# Patient Record
Sex: Female | Born: 1990 | Hispanic: No | Smoking: Never smoker
Health system: Southern US, Community
[De-identification: ages and names within clinical notes are randomized; demographics above are authoritative.]

## PROBLEM LIST (undated history)

## (undated) ENCOUNTER — Inpatient Hospital Stay (HOSPITAL_COMMUNITY): Payer: Self-pay

## (undated) DIAGNOSIS — E119 Type 2 diabetes mellitus without complications: Secondary | ICD-10-CM

## (undated) DIAGNOSIS — O09299 Supervision of pregnancy with other poor reproductive or obstetric history, unspecified trimester: Secondary | ICD-10-CM

## (undated) HISTORY — PX: NO PAST SURGERIES: SHX2092

---

## 2013-12-18 DIAGNOSIS — F329 Major depressive disorder, single episode, unspecified: Secondary | ICD-10-CM | POA: Insufficient documentation

## 2013-12-18 DIAGNOSIS — F32A Depression, unspecified: Secondary | ICD-10-CM | POA: Insufficient documentation

## 2013-12-18 DIAGNOSIS — O1490 Unspecified pre-eclampsia, unspecified trimester: Secondary | ICD-10-CM

## 2014-06-19 ENCOUNTER — Emergency Department: Payer: Self-pay | Admitting: Student

## 2014-06-19 LAB — CBC
HCT: 42 % (ref 35.0–47.0)
HGB: 12.6 g/dL (ref 12.0–16.0)
MCH: 21 pg — ABNORMAL LOW (ref 26.0–34.0)
MCHC: 30 g/dL — ABNORMAL LOW (ref 32.0–36.0)
MCV: 70 fL — ABNORMAL LOW (ref 80–100)
PLATELETS: 234 10*3/uL (ref 150–440)
RBC: 5.99 10*6/uL — AB (ref 3.80–5.20)
RDW: 16.1 % — ABNORMAL HIGH (ref 11.5–14.5)
WBC: 10.5 10*3/uL (ref 3.6–11.0)

## 2014-06-19 LAB — COMPREHENSIVE METABOLIC PANEL
ALK PHOS: 96 U/L
Albumin: 3.3 g/dL — ABNORMAL LOW (ref 3.4–5.0)
Anion Gap: 6 — ABNORMAL LOW (ref 7–16)
BUN: 13 mg/dL (ref 7–18)
Bilirubin,Total: 0.4 mg/dL (ref 0.2–1.0)
CHLORIDE: 107 mmol/L (ref 98–107)
CO2: 25 mmol/L (ref 21–32)
Calcium, Total: 8.9 mg/dL (ref 8.5–10.1)
Creatinine: 0.7 mg/dL (ref 0.60–1.30)
EGFR (African American): >60
EGFR (Non-African Amer.): >60
Glucose: 176 mg/dL — ABNORMAL HIGH (ref 65–99)
Osmolality: 280 (ref 275–301)
Potassium: 4 mmol/L (ref 3.5–5.1)
SGOT(AST): 15 U/L (ref 15–37)
SGPT (ALT): 19 U/L
Sodium: 138 mmol/L (ref 136–145)
TOTAL PROTEIN: 7.2 g/dL (ref 6.4–8.2)

## 2014-06-19 LAB — URINALYSIS, COMPLETE
Bilirubin,UR: NEGATIVE
Blood: NEGATIVE
Glucose,UR: NEGATIVE mg/dL (ref 0–75)
Leukocyte Esterase: NEGATIVE
Nitrite: NEGATIVE
Ph: 5 (ref 4.5–8.0)
Protein: NEGATIVE
RBC,UR: 8 /HPF (ref 0–5)
Specific Gravity: 1.015 (ref 1.003–1.030)
WBC UR: NONE SEEN /HPF (ref 0–5)

## 2014-06-19 LAB — LIPASE, BLOOD: LIPASE: 94 U/L (ref 73–393)

## 2016-05-01 ENCOUNTER — Encounter (HOSPITAL_COMMUNITY): Payer: Self-pay | Admitting: Emergency Medicine

## 2016-05-01 ENCOUNTER — Emergency Department (HOSPITAL_COMMUNITY)
Admission: EM | Admit: 2016-05-01 | Discharge: 2016-05-02 | Disposition: A | Discharge status: Home / Self Care | Payer: Self-pay | Attending: Emergency Medicine | Admitting: Emergency Medicine

## 2016-05-01 DIAGNOSIS — T7421XA Adult sexual abuse, confirmed, initial encounter: Secondary | ICD-10-CM | POA: Insufficient documentation

## 2016-05-01 MED ORDER — LORAZEPAM 1 MG PO TABS
1.0000 mg | ORAL_TABLET | Freq: Once | ORAL | Status: AC
  Administered 2016-05-02: 1 mg via ORAL
  Filled 2016-05-01: qty 1

## 2016-05-01 NOTE — ED Notes (Signed)
Spoke to [employer]ANE RN who states okay for pt to eat and drink.  Will make MD/PA aware of items needed for medical clearance and re-page SANE RN when pt ready for dispo.
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
## 2016-05-01 NOTE — ED Notes (Signed)
Patient requesting STD screening

## 2016-05-02 ENCOUNTER — Ambulatory Visit (HOSPITAL_COMMUNITY)
Admission: EM | Admit: 2016-05-02 | Discharge: 2016-05-02 | Disposition: A | Discharge status: Home / Self Care | Payer: No Typology Code available for payment source | Source: Ambulatory Visit | Attending: Emergency Medicine | Admitting: Emergency Medicine

## 2016-05-02 DIAGNOSIS — Z0441 Encounter for examination and observation following alleged adult rape: Secondary | ICD-10-CM | POA: Diagnosis not present

## 2016-05-02 MED ORDER — ULIPRISTAL ACETATE 30 MG PO TABS
30.0000 mg | ORAL_TABLET | Freq: Once | ORAL | Status: AC
  Administered 2016-05-02: 30 mg via ORAL
  Filled 2016-05-02 (×2): qty 1

## 2016-05-02 MED ORDER — CEFTRIAXONE SODIUM 250 MG IJ SOLR
250.0000 mg | Freq: Once | INTRAMUSCULAR | Status: AC
  Administered 2016-05-02: 250 mg via INTRAMUSCULAR
  Filled 2016-05-02: qty 250

## 2016-05-02 MED ORDER — LIDOCAINE HCL (PF) 1 % IJ SOLN
0.9000 mL | Freq: Once | INTRAMUSCULAR | Status: AC
  Administered 2016-05-02: 0.9 mL
  Filled 2016-05-02 (×2): qty 5

## 2016-05-02 MED ORDER — AZITHROMYCIN 250 MG PO TABS
1000.0000 mg | ORAL_TABLET | Freq: Once | ORAL | Status: AC
  Administered 2016-05-02: 1000 mg via ORAL
  Filled 2016-05-02: qty 4

## 2016-05-02 MED ORDER — METRONIDAZOLE 500 MG PO TABS
2000.0000 mg | ORAL_TABLET | Freq: Once | ORAL | Status: DC
  Filled 2016-05-02: qty 4

## 2016-05-02 NOTE — ED Notes (Signed)
SANE RN at bedside.
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
## 2016-05-05 LAB — POC URINE PREG, ED: Preg Test, Ur: NEGATIVE

## 2016-05-25 ENCOUNTER — Encounter: Payer: No Typology Code available for payment source | Admitting: Obstetrics & Gynecology

## 2016-05-25 NOTE — Progress Notes (Signed)
Pt did not arrive for appt.  clh-S

## 2016-05-25 NOTE — SANE Note (Deleted)
Test for date/time blank note.

## 2016-05-25 NOTE — Discharge Instructions (Signed)
Sexual Assault or Rape °Sexual assault is any sexual activity that a person is forced, threatened, or coerced into participating in. It may or may not involve physical contact. You are being sexually abused if you are forced to have sexual contact of any kind. Sexual assault is called rape if penetration has occurred (vaginal, oral, or anal). Many times, sexual assaults are committed by a friend, relative, or associate. Sexual assault and rape are never the victim's fault.  °Sexual assault can result in various health problems for the person who was assaulted. Some of these problems include: °· Physical injuries in the genital area or other areas of the body. °· Risk of unwanted pregnancy. °· Risk of sexually transmitted infections (STIs). °· Psychological problems such as anxiety, depression, or posttraumatic stress disorder. °WHAT STEPS SHOULD BE TAKEN AFTER A SEXUAL ASSAULT? °If you have been sexually assaulted, you should take the following steps as soon as possible: °· Go to a safe area as quickly as possible and call your local emergency services (911 in [place] Get away from the area where you have been attacked.   °· Do not wash, shower, comb your hair, or clean any part of your body.   °· Do not change your clothes.   °· Do not remove or touch anything in the area where you were assaulted.   °· Go to an emergency room for a complete physical exam. Get the necessary tests to protect yourself from STIs or pregnancy. You may be treated for an STI even if no signs of one are present. Emergency contraceptive medicines are also available to help prevent pregnancy, if this is desired. You may need to be examined by a specially trained health care provider. °· Have the health care provider collect evidence during the exam, even if you are not sure if you will file a report with the police. °· Find out how to file the correct papers with the authorities. This is important for all assaults, even if they were committed  by a family member or friend. °· Find out where you can get additional help and support, such as a local rape crisis center. °· Follow up with your health care provider as directed.   °HOW CAN YOU REDUCE THE CHANCES OF SEXUAL ASSAULT? °Take the following steps to help reduce your chances of being sexually assaulted: °· Consider carrying mace or pepper spray for protection against an attacker.   °· Consider taking a self-defense course. °· Do not try to fight off an attacker if he or she has a gun or knife.   °· Be aware of your surroundings, what is happening around you, and who might be there.   °· Be assertive, trust your instincts, and walk with confidence and direction. °· Be careful not to drink too much alcohol or use other intoxicants. These can reduce your ability to fight off an assault. °· Always lock your doors and windows. Be sure to have high-quality locks for your home.   °· Do not let people enter your house if you do not know them.   °· Get a home security system that has a siren if you are able.   °· Protect the keys to your house and car. Do not lend them out. Do not put your name and address on them. If you lose them, get your locks changed.   °· Always lock your car and have your key ready to open the door before approaching the car.   °· Park in a well-lit and busy area. °· Plan your driving routes   so that you travel on well-lit and frequently used streets.  °· Keep your car serviced. Always have at least half a tank of gas in it.   °· Do not go into isolated areas alone. This includes open garages, empty buildings or offices, or public laundry rooms.   °· Do not walk or jog alone, especially when it is dark.   °· Never hitchhike.   °· If your car breaks down, call the police for help on your cell phone and stay inside the car with your doors locked and windows up.   °· If you are being followed, go to a busy area and call for help.   °· If you are stopped by a police officer, especially one in  an unmarked police car, keep your door locked. Do not put your window down all the way. Ask the officer to show you identification first.   °· Be aware of "date rape drugs" that can be placed in a drink when you are not looking. These drugs can make you unable to fight off an assault. °FOR MORE INFORMATION °· Office on Women's Health, U.S. Department of Health and Human Services: www.womenshealth.gov/violence-against-women/types-of-violence/sexual-assault-and-abuse.html °· National Sexual Assault Hotline: 1-800-656-HOPE (4673) °· National Domestic Violence Hotline: 1-800-799-SAFE (7233) or www.thehotline.org °  °This information is not intended to replace advice given to you by your health care provider. Make sure you discuss any questions you have with your health care provider. °  °Document Released: 10/23/2000 Document Revised: 06/28/2013 Document Reviewed: 05/31/2015 °Elsevier Interactive Patient Education ©2016 Elsevier Inc. ° °
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
## 2017-06-27 ENCOUNTER — Inpatient Hospital Stay (HOSPITAL_COMMUNITY): Payer: Medicaid Other

## 2017-06-27 ENCOUNTER — Encounter (HOSPITAL_COMMUNITY): Payer: Self-pay

## 2017-06-27 ENCOUNTER — Inpatient Hospital Stay (HOSPITAL_COMMUNITY)
Admission: AD | Admit: 2017-06-27 | Discharge: 2017-06-27 | Disposition: A | Discharge status: Home / Self Care | Payer: Medicaid Other | Source: Ambulatory Visit | Attending: Obstetrics & Gynecology | Admitting: Obstetrics & Gynecology

## 2017-06-27 DIAGNOSIS — O208 Other hemorrhage in early pregnancy: Secondary | ICD-10-CM | POA: Insufficient documentation

## 2017-06-27 DIAGNOSIS — O23591 Infection of other part of genital tract in pregnancy, first trimester: Secondary | ICD-10-CM | POA: Insufficient documentation

## 2017-06-27 DIAGNOSIS — O26899 Other specified pregnancy related conditions, unspecified trimester: Secondary | ICD-10-CM

## 2017-06-27 DIAGNOSIS — O468X1 Other antepartum hemorrhage, first trimester: Secondary | ICD-10-CM

## 2017-06-27 DIAGNOSIS — B9689 Other specified bacterial agents as the cause of diseases classified elsewhere: Secondary | ICD-10-CM

## 2017-06-27 DIAGNOSIS — Z3A01 Less than 8 weeks gestation of pregnancy: Secondary | ICD-10-CM | POA: Diagnosis not present

## 2017-06-27 DIAGNOSIS — N760 Acute vaginitis: Secondary | ICD-10-CM

## 2017-06-27 DIAGNOSIS — R109 Unspecified abdominal pain: Secondary | ICD-10-CM | POA: Insufficient documentation

## 2017-06-27 DIAGNOSIS — O418X10 Other specified disorders of amniotic fluid and membranes, first trimester, not applicable or unspecified: Secondary | ICD-10-CM

## 2017-06-27 DIAGNOSIS — O26891 Other specified pregnancy related conditions, first trimester: Secondary | ICD-10-CM

## 2017-06-27 DIAGNOSIS — O209 Hemorrhage in early pregnancy, unspecified: Secondary | ICD-10-CM

## 2017-06-27 HISTORY — DX: Type 2 diabetes mellitus without complications: E11.9

## 2017-06-27 LAB — URINALYSIS, ROUTINE W REFLEX MICROSCOPIC
BACTERIA UA: NONE SEEN
Bilirubin Urine: NEGATIVE
HGB URINE DIPSTICK: NEGATIVE
Ketones, ur: NEGATIVE mg/dL
LEUKOCYTES UA: NEGATIVE
NITRITE: NEGATIVE
PH: 6 (ref 5.0–8.0)
Protein, ur: NEGATIVE mg/dL
SPECIFIC GRAVITY, URINE: 1.03 (ref 1.005–1.030)

## 2017-06-27 LAB — WET PREP, GENITAL
SPERM: NONE SEEN
TRICH WET PREP: NONE SEEN
YEAST WET PREP: NONE SEEN

## 2017-06-27 LAB — CBC
HEMATOCRIT: 35.8 % — AB (ref 36.0–46.0)
HEMOGLOBIN: 11.7 g/dL — AB (ref 12.0–15.0)
MCH: 22 pg — ABNORMAL LOW (ref 26.0–34.0)
MCHC: 32.7 g/dL (ref 30.0–36.0)
MCV: 67.4 fL — ABNORMAL LOW (ref 78.0–100.0)
Platelets: 296 10*3/uL (ref 150–400)
RBC: 5.31 MIL/uL — ABNORMAL HIGH (ref 3.87–5.11)
RDW: 14.4 % (ref 11.5–15.5)
WBC: 7.5 10*3/uL (ref 4.0–10.5)

## 2017-06-27 LAB — POCT PREGNANCY, URINE: PREG TEST UR: POSITIVE — AB

## 2017-06-27 LAB — ABO/RH: ABO/RH(D): A POS

## 2017-06-27 LAB — HCG, QUANTITATIVE, PREGNANCY: HCG, BETA CHAIN, QUANT, S: 21646 m[IU]/mL — AB (ref <5)

## 2017-06-27 MED ORDER — METRONIDAZOLE 500 MG PO TABS: 500.0000 mg | ORAL_TABLET | Freq: Two times a day (BID) | ORAL | 0 refills | Status: DC

## 2017-06-27 NOTE — MAU Note (Signed)
Reports lots of back and abdominal pain. Went to the bathroom when she was out and noticed some brown discharge when she wiped. No recent intercourse. No clots that she could tell.

## 2017-06-27 NOTE — Discharge Instructions (Signed)
Abdominal Pain During Pregnancy Abdominal pain is common in pregnancy. Most of the time, it does not cause harm. There are many causes of abdominal pain. Some causes are more serious than others and sometimes the cause is not known. Abdominal pain can be a sign that something is very wrong with the pregnancy or the pain may have nothing to do with the pregnancy. Always tell your health care provider if you have any abdominal pain. Follow these instructions at home:  Do not have sex or put anything in your vagina until your symptoms go away completely.  Watch your abdominal pain for any changes.  Get plenty of rest until your pain improves.  Drink enough fluid to keep your urine clear or pale yellow.  Take over-the-counter or prescription medicines only as told by your health care provider.  Keep all follow-up visits as told by your health care provider. This is important. Contact a health care provider if:  You have a fever.  Your pain gets worse or you have cramping.  Your pain continues after resting. Get help right away if:  You are bleeding, leaking fluid, or passing tissue from the vagina.  You have vomiting or diarrhea that does not go away.  You have painful or bloody urination.  You notice a decrease in your baby's movements.  You feel very weak or faint.  You have shortness of breath.  You develop a severe headache with abdominal pain.  You have abnormal vaginal discharge with abdominal pain. This information is not intended to replace advice given to you by your health care provider. Make sure you discuss any questions you have with your health care provider. Document Released: 10/26/2005 Document Revised: 08/06/2016 Document Reviewed: 05/25/2013 Elsevier Interactive Patient Education  2018 Elsevier Inc.  Subchorionic Hematoma A subchorionic hematoma is a gathering of blood between the outer wall of the placenta and the inner wall of the womb (uterus). The  placenta is the organ that connects the fetus to the wall of the uterus. The placenta performs the feeding, breathing (oxygen to the fetus), and waste removal (excretory work) of the fetus. Subchorionic hematoma is the most common abnormality found on a result from ultrasonography done during the first trimester or early second trimester of pregnancy. If there has been little or no vaginal bleeding, early small hematomas usually shrink on their own and do not affect your baby or pregnancy. The blood is gradually absorbed over 1-2 weeks. When bleeding starts later in pregnancy or the hematoma is larger or occurs in an older pregnant woman, the outcome may not be as good. Larger hematomas may get bigger, which increases the chances for miscarriage. Subchorionic hematoma also increases the risk of premature detachment of the placenta from the uterus, preterm (premature) labor, and stillbirth. Follow these instructions at home:  Stay on bed rest if your health care provider recommends this. Although bed rest will not prevent more bleeding or prevent a miscarriage, your health care provider may recommend bed rest until you are advised otherwise.  Avoid heavy lifting (more than 10 lb [4.5 kg]), exercise, sexual intercourse, or douching as directed by your health care provider.  Keep track of the number of pads you use each day and how soaked (saturated) they are. Write down this information.  Do not use tampons.  Keep all follow-up appointments as directed by your health care provider. Your health care provider may ask you to have follow-up blood tests or ultrasound tests or both. Get help right  away if:  You have severe cramps in your stomach, back, abdomen, or pelvis.  You have a fever.  You pass large clots or tissue. Save any tissue for your health care provider to look at.  Your bleeding increases or you become lightheaded, feel weak, or have fainting episodes. This information is not intended  to replace advice given to you by your health care provider. Make sure you discuss any questions you have with your health care provider. Document Released: 02/10/2007 Document Revised: 04/02/2016 Document Reviewed: 05/25/2013 Elsevier Interactive Patient Education  2017 Elsevier Inc.  Vaginal Bleeding During Pregnancy, First Trimester A small amount of bleeding (spotting) from the vagina is common in early pregnancy. Sometimes the bleeding is normal and is not a problem, and sometimes it is a sign of something serious. Be sure to tell your doctor about any bleeding from your vagina right away. Follow these instructions at home:  Watch your condition for any changes.  Follow your doctor's instructions about how active you can be.  If you are on bed rest: ? You may need to stay in bed and only get up to use the bathroom. ? You may be allowed to do some activities. ? If you need help, make plans for someone to help you.  Write down: ? The number of pads you use each day. ? How often you change pads. ? How soaked (saturated) your pads are.  Do not use tampons.  Do not douche.  Do not have sex or orgasms until your doctor says it is okay.  If you pass any tissue from your vagina, save the tissue so you can show it to your doctor.  Only take medicines as told by your doctor.  Do not take aspirin because it can make you bleed.  Keep all follow-up visits as told by your doctor. Contact a doctor if:  You bleed from your vagina.  You have cramps.  You have labor pains.  You have a fever that does not go away after you take medicine. Get help right away if:  You have very bad cramps in your back or belly (abdomen).  You pass large clots or tissue from your vagina.  You bleed more.  You feel light-headed or weak.  You pass out (faint).  You have chills.  You are leaking fluid or have a gush of fluid from your vagina.  You pass out while pooping (having a bowel  movement). This information is not intended to replace advice given to you by your health care provider. Make sure you discuss any questions you have with your health care provider. Document Released: 03/12/2014 Document Revised: 04/02/2016 Document Reviewed: 07/03/2013 Elsevier Interactive Patient Education  [employer].

## 2017-06-27 NOTE — MAU Provider Note (Signed)
History     CSN: [id number]  Arrival date and time: 06/27/17 1437   First Provider Initiated Contact with Patient 06/27/17 1541      Chief Complaint  Patient presents with  . Vaginal Bleeding  . Back Pain  . Abdominal Pain   G2P1001 @[redacted]w[redacted]d  by LMP here with abdominal cramping. Cramping started about 2 weeks ago. Describes as bilateral, lower abdomen, and worse on left side. Reports brown discharge today. No fevers. No urinary sx.     OB History    Gravida Para Term Preterm AB Living   2 1 1     1    SAB TAB Ectopic Multiple Live Births                  Past Medical History:  Diagnosis Date  . Diabetes mellitus without complication (HCC)    type2    Past Surgical History:  Procedure Laterality Date  . NO PAST SURGERIES      Family History  Problem Relation Age of Onset  . Diabetes Mother     Social History  Substance Use Topics  . Smoking status: Never Smoker  . Smokeless tobacco: Never Used  . Alcohol use No    Allergies: No Known Allergies  No prescriptions prior to admission.    Review of Systems  Constitutional: Negative for fever.  Gastrointestinal: Positive for abdominal pain and constipation.  Genitourinary: Positive for vaginal discharge. Negative for dysuria, frequency and urgency.   Physical Exam   Blood pressure 130/79, pulse (!) 105, temperature 98.3 F (36.8 C), temperature source Oral, resp. rate 16, height 5\' 4"  (1.626 m), weight 208 lb (94.3 kg), last menstrual period 05/04/2017.  Physical Exam  Nursing note and vitals reviewed. Constitutional: She is oriented to person, place, and time. She appears well-developed and well-nourished. No distress.  HENT:  Head: Normocephalic.  Neck: Normal range of motion.  Respiratory: Effort normal. No respiratory distress.  GI: Soft. She exhibits no distension and no mass. There is no tenderness. There is no rebound and no guarding.  Genitourinary:  Genitourinary Comments: External: no lesions  or erythema Vagina: rugated, pink, moist, moderate white frothy discharge Uterus: non enlarged, anteverted, non tender, no CMT Adnexae: no masses, no tenderness left, no tenderness right   Musculoskeletal: Normal range of motion.  Neurological: She is alert and oriented to person, place, and time.  Skin: Skin is warm and dry.  Psychiatric: She has a normal mood and affect.   Results for orders placed or performed during the hospital encounter of 06/27/17 (from the past 24 hour(s))  Urinalysis, Routine w reflex microscopic     Status: Abnormal   Collection Time: 06/27/17  2:51 PM  Result Value Ref Range   Color, Urine STRAW (A) YELLOW   APPearance CLEAR CLEAR   Specific Gravity, Urine 1.030 1.005 - 1.030   pH 6.0 5.0 - 8.0   Glucose, UA >=500 (A) NEGATIVE mg/dL   Hgb urine dipstick NEGATIVE NEGATIVE   Bilirubin Urine NEGATIVE NEGATIVE   Ketones, ur NEGATIVE NEGATIVE mg/dL   Protein, ur NEGATIVE NEGATIVE mg/dL   Nitrite NEGATIVE NEGATIVE   Leukocytes, UA NEGATIVE NEGATIVE   RBC / HPF 0-5 0 - 5 RBC/hpf   WBC, UA 0-5 0 - 5 WBC/hpf   Bacteria, UA NONE SEEN NONE SEEN   Squamous Epithelial / LPF 0-5 (A) NONE SEEN  Pregnancy, urine POC     Status: Abnormal   Collection Time: 06/27/17  2:55 PM  Result Value  Ref Range   Preg Test, Ur POSITIVE (A) NEGATIVE  ABO/Rh     Status: None (Preliminary result)   Collection Time: 06/27/17  3:12 PM  Result Value Ref Range   ABO/RH(D) A POS   CBC     Status: Abnormal   Collection Time: 06/27/17  3:12 PM  Result Value Ref Range   WBC 7.5 4.0 - 10.5 K/uL   RBC 5.31 (H) 3.87 - 5.11 MIL/uL   Hemoglobin 11.7 (L) 12.0 - 15.0 g/dL   HCT 81.1 (L) 91.4 - 78.2 %   MCV 67.4 (L) 78.0 - 100.0 fL   MCH 22.0 (L) 26.0 - 34.0 pg   MCHC 32.7 30.0 - 36.0 g/dL   RDW 95.6 21.3 - 08.6 %   Platelets 296 150 - 400 K/uL  Wet prep, genital     Status: Abnormal   Collection Time: 06/27/17  3:50 PM  Result Value Ref Range   Yeast Wet Prep HPF POC NONE SEEN NONE  SEEN   Trich, Wet Prep NONE SEEN NONE SEEN   Clue Cells Wet Prep HPF POC PRESENT (A) NONE SEEN   WBC, Wet Prep HPF POC MANY (A) NONE SEEN   Sperm NONE SEEN    US Ob Comp Less 14 Wks  Result Date: 06/27/2017 CLINICAL DATA:  Pain.  Pregnant patient. EXAM: OBSTETRIC <14 WK [place] AND TRANSVAGINAL OB US TECHNIQUE: Both transabdominal and transvaginal ultrasound examinations were performed for complete evaluation of the gestation as well as the maternal uterus, adnexal regions, and pelvic cul-de-sac. Transvaginal technique was performed to assess early pregnancy. COMPARISON:  None. FINDINGS: Intrauterine gestational sac: Single Yolk sac:  Visualized. Embryo:  Visualized. Cardiac Activity: Visualized. Heart Rate: 127  bpm MSD:   mm    w     d CRL:  9.5  mm   6 w   6 d                  [place] [facility]: February 14, 2018 Subchorionic hemorrhage: There is a small inferior subchorionic hemorrhage. Maternal uterus/adnexae: Both ovaries are normal in appearance. IMPRESSION: 1. Single live IUP.  Small inferior subchorionic hemorrhage. Electronically Signed   By: Gerome Sam III M.D   On: 06/27/2017 17:08   US Ob Transvaginal  Result Date: 06/27/2017 CLINICAL DATA:  Pain.  Pregnant patient. EXAM: OBSTETRIC <14 WK [place] AND TRANSVAGINAL OB US TECHNIQUE: Both transabdominal and transvaginal ultrasound examinations were performed for complete evaluation of the gestation as well as the maternal uterus, adnexal regions, and pelvic cul-de-sac. Transvaginal technique was performed to assess early pregnancy. COMPARISON:  None. FINDINGS: Intrauterine gestational sac: Single Yolk sac:  Visualized. Embryo:  Visualized. Cardiac Activity: Visualized. Heart Rate: 127  bpm MSD:   mm    w     d CRL:  9.5  mm   6 w   6 d                  [place] [facility]: February 14, 2018 Subchorionic hemorrhage: There is a small inferior subchorionic hemorrhage. Maternal uterus/adnexae: Both ovaries are normal in appearance. IMPRESSION: 1. Single live IUP.  Small inferior  subchorionic hemorrhage. Electronically Signed   By: Gerome Sam III M.D   On: 06/27/2017 17:08   MAU Course  Procedures  MDM Labs and [place] ordered. Transfer of care given to [facility], FNP Donette Larry, CNM  06/27/2017 4:07 PM   Assessment and Plan   A:   Abdominal pain in early pregnancy  Spotting in early pregnancy       Viable IUD [redacted]w[redacted]d gestation         Small Subchorionic Hemorrhage       Bacterial vaginosis  P:  Rx for Flagyl to pharmacy        Begin prenatal vitamins        Begin prenatal care with MD of choice.

## 2017-06-28 LAB — GC/CHLAMYDIA PROBE AMP (~~LOC~~) NOT AT ARMC
Chlamydia: NEGATIVE
NEISSERIA GONORRHEA: NEGATIVE

## 2017-06-29 ENCOUNTER — Encounter: Payer: Medicaid Other | Attending: Obstetrics and Gynecology | Admitting: *Deleted

## 2017-06-29 ENCOUNTER — Other Ambulatory Visit: Payer: Self-pay

## 2017-06-29 ENCOUNTER — Ambulatory Visit: Payer: Medicaid Other | Admitting: *Deleted

## 2017-06-29 DIAGNOSIS — E1165 Type 2 diabetes mellitus with hyperglycemia: Secondary | ICD-10-CM | POA: Diagnosis not present

## 2017-06-29 DIAGNOSIS — O24111 Pre-existing diabetes mellitus, type 2, in pregnancy, first trimester: Principal | ICD-10-CM

## 2017-06-29 DIAGNOSIS — Z3A00 Weeks of gestation of pregnancy not specified: Secondary | ICD-10-CM | POA: Diagnosis not present

## 2017-06-29 DIAGNOSIS — Z713 Dietary counseling and surveillance: Secondary | ICD-10-CM | POA: Diagnosis not present

## 2017-06-29 DIAGNOSIS — O24119 Pre-existing diabetes mellitus, type 2, in pregnancy, unspecified trimester: Secondary | ICD-10-CM | POA: Insufficient documentation

## 2017-06-29 MED ORDER — ACCU-CHEK FASTCLIX LANCETS MISC: 1.0000 | Freq: Four times a day (QID) | 12 refills | Status: DC

## 2017-06-29 MED ORDER — ACCU-CHEK GUIDE W/DEVICE KIT: 1.0000 | PACK | Freq: Four times a day (QID) | 0 refills | Status: DC

## 2017-06-29 MED ORDER — GLUCOSE BLOOD VI STRP: ORAL_STRIP | 12 refills | Status: DC

## 2017-06-29 NOTE — Progress Notes (Signed)
  Patient was seen on 06/29/2017 for type 2 Diabetes and pregnancy self-management. EDD: 02/08/2018  Today at 8 weeks, 0 days. Patient states she had diabetes with her last pregnancy 3 years ago and went to [facility] for her prenatal care at that time. She does not currently have a meter but is familiar with how to check her BG.  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 Her hours are 7-3:30, Monday thru Friday. The following learning objectives were met by the patient :   States the definition of type 2 Diabetes and pregnancy  States why dietary management is important in controlling blood glucose  Describes the effects of carbohydrates on blood glucose levels  Demonstrates ability to create a balanced meal plan  Demonstrates carbohydrate counting   States when to check blood glucose levels  Demonstrates proper blood glucose monitoring techniques  States the effect of stress and exercise on blood glucose levels  States the importance of limiting caffeine and abstaining from alcohol and smoking  Plan:  Aim for 3 Carb Choices per meal (45 grams) +/- 1 either way  Aim for 1-2 Carbs per snack Begin reading food labels for Total Carbohydrate of foods Consider  increasing your activity level by walking or other activity daily as tolerated Begin checking BG before breakfast and 2 hours after first bite of breakfast, lunch and dinner as directed by MD  Bring Log Book to every medical appointment or record in Baby Scripts  Take medication if directed by MD  Patient informed of Babyscripts and is agreeable to participate. She understands that she must ACCEPT the invitation when it arrives and to record BG at the time she is testing, not at the end of the day. If she does not record in Babyscripts, she has a Log Sheet to write down her BG numbers and bring to every medical appt.   Blood glucose monitor Rx called into pharmacy: [place] with Fast  [employer] drums Patient instructed to test pre breakfast and 2 hours each meal as directed by MD  Patient instructed to monitor glucose levels: FBS: 60 - 95 mg/dl 2 hour: <120 mg/dl  Patient received the following handouts:  Nutrition Diabetes and Pregnancy  Carbohydrate Counting List  Patient will be seen for follow-up as needed.

## 2017-06-30 ENCOUNTER — Encounter: Payer: Self-pay | Admitting: *Deleted

## 2017-06-30 LAB — PREGNANCY, URINE: PREG TEST UR: POSITIVE

## 2017-08-04 ENCOUNTER — Other Ambulatory Visit (HOSPITAL_COMMUNITY)
Admission: RE | Admit: 2017-08-04 | Discharge: 2017-08-04 | Disposition: A | Discharge status: Home / Self Care | Payer: Medicaid Other | Source: Ambulatory Visit | Attending: Family Medicine | Admitting: Family Medicine

## 2017-08-04 ENCOUNTER — Telehealth: Payer: Self-pay | Admitting: General Practice

## 2017-08-04 ENCOUNTER — Encounter: Payer: No Typology Code available for payment source | Admitting: Medical

## 2017-08-04 ENCOUNTER — Encounter: Payer: Self-pay | Admitting: Family Medicine

## 2017-08-04 ENCOUNTER — Ambulatory Visit (INDEPENDENT_AMBULATORY_CARE_PROVIDER_SITE_OTHER): Payer: Medicaid Other | Admitting: Family Medicine

## 2017-08-04 DIAGNOSIS — O0991 Supervision of high risk pregnancy, unspecified, first trimester: Secondary | ICD-10-CM

## 2017-08-04 DIAGNOSIS — E119 Type 2 diabetes mellitus without complications: Secondary | ICD-10-CM

## 2017-08-04 DIAGNOSIS — Z23 Encounter for immunization: Secondary | ICD-10-CM | POA: Diagnosis not present

## 2017-08-04 DIAGNOSIS — O10011 Pre-existing essential hypertension complicating pregnancy, first trimester: Secondary | ICD-10-CM

## 2017-08-04 DIAGNOSIS — O24111 Pre-existing diabetes mellitus, type 2, in pregnancy, first trimester: Secondary | ICD-10-CM | POA: Diagnosis not present

## 2017-08-04 DIAGNOSIS — O99211 Obesity complicating pregnancy, first trimester: Secondary | ICD-10-CM | POA: Insufficient documentation

## 2017-08-04 DIAGNOSIS — O99210 Obesity complicating pregnancy, unspecified trimester: Secondary | ICD-10-CM | POA: Diagnosis not present

## 2017-08-04 DIAGNOSIS — O10012 Pre-existing essential hypertension complicating pregnancy, second trimester: Secondary | ICD-10-CM

## 2017-08-04 DIAGNOSIS — O0990 Supervision of high risk pregnancy, unspecified, unspecified trimester: Secondary | ICD-10-CM

## 2017-08-04 DIAGNOSIS — Z124 Encounter for screening for malignant neoplasm of cervix: Secondary | ICD-10-CM | POA: Diagnosis not present

## 2017-08-04 DIAGNOSIS — R87810 Cervical high risk human papillomavirus (HPV) DNA test positive: Secondary | ICD-10-CM

## 2017-08-04 DIAGNOSIS — R87610 Atypical squamous cells of undetermined significance on cytologic smear of cervix (ASC-US): Secondary | ICD-10-CM | POA: Diagnosis not present

## 2017-08-04 DIAGNOSIS — Z794 Long term (current) use of insulin: Secondary | ICD-10-CM

## 2017-08-04 LAB — POCT URINALYSIS DIP (DEVICE)
BILIRUBIN URINE: NEGATIVE
GLUCOSE, UA: NEGATIVE mg/dL
KETONES UR: NEGATIVE mg/dL
NITRITE: NEGATIVE
PH: 6 (ref 5.0–8.0)
Protein, ur: 30 mg/dL — AB
Specific Gravity, Urine: 1.025 (ref 1.005–1.030)
Urobilinogen, UA: 1 mg/dL (ref 0.0–1.0)

## 2017-08-04 LAB — GLUCOSE, CAPILLARY: GLUCOSE-CAPILLARY: 220 mg/dL — AB (ref 65–99)

## 2017-08-04 MED ORDER — INSULIN NPH (HUMAN) (ISOPHANE) 100 UNIT/ML ~~LOC~~ SUSP: 11.0000 [IU] | Freq: Two times a day (BID) | SUBCUTANEOUS | 3 refills | Status: DC

## 2017-08-04 MED ORDER — "INSULIN SYRINGE 31G X 5/16"" 1 ML MISC": 1.0000 [IU] | 5 refills | Status: DC | PRN

## 2017-08-04 MED ORDER — INSULIN ASPART 100 UNIT/ML ~~LOC~~ SOLN: 16.0000 [IU] | Freq: Three times a day (TID) | SUBCUTANEOUS | 12 refills | Status: DC

## 2017-08-04 MED ORDER — ASPIRIN EC 81 MG PO TBEC: 81.0000 mg | DELAYED_RELEASE_TABLET | Freq: Every day | ORAL | 3 refills | Status: DC

## 2017-08-04 NOTE — Progress Notes (Signed)
Subjective:    Jennifer Montes is a G2P1001 [redacted]w[redacted]d being seen today for her first obstetrical visit.  Her obstetrical history is significant for obesity, pre-eclampsia and diabetes Type 2. Patient diagnosed at age 26--on metformin and insulin but only on diet last pregnancy. She has not been following diet or checking her blood sugar. She admits to poor dietary adherence. Patient does intend to breast feed. Pregnancy history fully reviewed.  Patient reports no complaints.  Vitals:   08/04/17 0814  BP: 134/84  Pulse: (!) 103  Weight: 214 lb (97.1 kg)    HISTORY: OB History  Gravida Para Term Preterm AB Living  0 0 1  SAB TAB Ectopic Multiple Live Births  0 0 0 0 1    # Outcome Date GA Lbr Len/2nd Weight Sex Delivery Anes PTL Lv  2 Current           1 Term 12/18/13 [redacted]w[redacted]d  7 lb 7 oz (3.374 kg) M Vag-Spont EPI N      Complications: Pre-eclampsia     Past Medical History:  Diagnosis Date  . Diabetes mellitus without complication (HCC)    type2   Past Surgical History:  Procedure Laterality Date  . NO PAST SURGERIES     Family History  Problem Relation Age of Onset  . Diabetes Mother      Exam    Uterus:   14 wk size  Pelvic Exam:    Perineum: Normal Perineum   Vulva: Bartholin's, Urethra, Skene's normal, female escutcheon   Vagina:  normal mucosa, normal discharge   Cervix: multiparous appearance and no bleeding following Pap   Adnexa: normal adnexa and no mass, fullness, tenderness   Bony Pelvis: average  System: Breast:  normal appearance, no masses or tenderness   Skin: normal coloration and turgor, no rashes    Neurologic: oriented, normal mood   Extremities: normal strength, tone, and muscle mass   HEENT extra ocular movement intact and sclera clear, anicteric   Mouth/Teeth mucous membranes moist, pharynx normal without lesions and dental hygiene good   Neck supple   Cardiovascular: regular rate and rhythm, no murmurs or gallops   Respiratory:   appears well, vitals normal, no respiratory distress, acyanotic, normal RR, ear and throat exam is normal, neck free of mass or lymphadenopathy, chest clear, no wheezing, crepitations, rhonchi, normal symmetric air entry   Abdomen: soft, non-tender; bowel sounds normal; no masses,  no organomegaly    Assessment/Plan:    Pregnancy: G2P1001 1. Supervision of high risk pregnancy, antepartum New OB labs First screen scheduled - Obstetric Panel, Including HIV - Hemoglobinopathy Evaluation - SMN1 Copy Number Analysis - Cystic fibrosis gene test - [place] MFM Fetal Nuchal Translucency; Future - Culture, OB Urine - Cytology - PAP - Flu Vaccine QUAD 36+ mos IM (Fluarix, Quad PF)  2. Pregnancy with type 2 diabetes mellitus in first trimester FBS today is 220 Baseline labs and urine check Optho exam Importance of 3 meals, 3 snack, diet re-inforced with patient. Risks of diabetes in pregnancy and in life discussed at length Begin weight based insulin--Novolog and NPH--patient requested being on insulin, is familiar with how to give it. - HgB A1c - insulin NPH Human (HUMULIN N,NOVOLIN N) 100 UNIT/ML injection; Inject 0.11 mLs (11 Units total) into the skin 2 (two) times daily. q am and q hs  Dispense: 10 mL; Refill: 3 - insulin aspart (NOVOLOG) 100 UNIT/ML injection; Inject 16 Units into the skin 3 (three) times  daily before meals.  Dispense: 10 mL; Refill: 12 - Protein / creatinine ratio, urine - Comprehensive metabolic panel - TSH - aspirin EC 81 MG tablet; Take 1 tablet (81 mg total) by mouth daily.  Dispense: 90 tablet; Refill: 3 - Ambulatory referral to Ophthalmology  3. Obesity in pregnancy   4. Type 2 diabetes mellitus without complication, with long-term current use of insulin (HCC)     Reva Bores 08/04/2017

## 2017-08-04 NOTE — Telephone Encounter (Signed)
Scheduled appt with [facility] for 1/3 @ 8am. Called patient, no answer- left message to call us back concerning an appt we have scheduled for you

## 2017-08-04 NOTE — Patient Instructions (Signed)
 Second Trimester of Pregnancy The second trimester is from week 14 through week 27 (months 4 through 6). The second trimester is often a time when you feel your best. Your body has adjusted to being pregnant, and you begin to feel better physically. Usually, morning sickness has lessened or quit completely, you may have more energy, and you may have an increase in appetite. The second trimester is also a time when the fetus is growing rapidly. At the end of the sixth month, the fetus is about 9 inches long and weighs about 1 pounds. You will likely begin to feel the baby move (quickening) between 16 and 20 weeks of pregnancy. Body changes during your second trimester Your body continues to go through many changes during your second trimester. The changes vary from woman to woman.  Your weight will continue to increase. You will notice your lower abdomen bulging out.  You may begin to get stretch marks on your hips, abdomen, and breasts.  You may develop headaches that can be relieved by medicines. The medicines should be approved by your health care provider.  You may urinate more often because the fetus is pressing on your bladder.  You may develop or continue to have heartburn as a result of your pregnancy.  You may develop constipation because certain hormones are causing the muscles that push waste through your intestines to slow down.  You may develop hemorrhoids or swollen, bulging veins (varicose veins).  You may have back pain. This is caused by: ? Weight gain. ? Pregnancy hormones that are relaxing the joints in your pelvis. ? A shift in weight and the muscles that support your balance.  Your breasts will continue to grow and they will continue to become tender.  Your gums may bleed and may be sensitive to brushing and flossing.  Dark spots or blotches (chloasma, mask of pregnancy) may develop on your face. This will likely fade after the baby is born.  A dark line from  your belly button to the pubic area (linea nigra) may appear. This will likely fade after the baby is born.  You may have changes in your hair. These can include thickening of your hair, rapid growth, and changes in texture. Some women also have hair loss during or after pregnancy, or hair that feels dry or thin. Your hair will most likely return to normal after your baby is born.  What to expect at prenatal visits During a routine prenatal visit:  You will be weighed to make sure you and the fetus are growing normally.  Your blood pressure will be taken.  Your abdomen will be measured to track your baby's growth.  The fetal heartbeat will be listened to.  Any test results from the previous visit will be discussed.  Your health care provider may ask you:  How you are feeling.  If you are feeling the baby move.  If you have had any abnormal symptoms, such as leaking fluid, bleeding, severe headaches, or abdominal cramping.  If you are using any tobacco products, including cigarettes, chewing tobacco, and electronic cigarettes.  If you have any questions.  Other tests that may be performed during your second trimester include:  Blood tests that check for: ? Low iron levels (anemia). ? High blood sugar that affects pregnant women (gestational diabetes) between 24 and 28 weeks. ? Rh antibodies. This is to check for a protein on red blood cells (Rh factor).  Urine tests to check for infections, diabetes,   or protein in the urine.  An ultrasound to confirm the proper growth and development of the baby.  An amniocentesis to check for possible genetic problems.  Fetal screens for spina bifida and Down syndrome.  HIV (human immunodeficiency virus) testing. Routine prenatal testing includes screening for HIV, unless you choose not to have this test.  Follow these instructions at home: Medicines  Follow your health care provider's instructions regarding medicine use. Specific  medicines may be either safe or unsafe to take during pregnancy.  Take a prenatal vitamin that contains at least 600 micrograms (mcg) of folic acid.  If you develop constipation, try taking a stool softener if your health care provider approves. Eating and drinking  Eat a balanced diet that includes fresh fruits and vegetables, whole grains, good sources of protein such as meat, eggs, or tofu, and low-fat dairy. Your health care provider will help you determine the amount of weight gain that is right for you.  Avoid raw meat and uncooked cheese. These carry germs that can cause birth defects in the baby.  If you have low calcium intake from food, talk to your health care provider about whether you should take a daily calcium supplement.  Limit foods that are high in fat and processed sugars, such as fried and sweet foods.  To prevent constipation: ? Drink enough fluid to keep your urine clear or pale yellow. ? Eat foods that are high in fiber, such as fresh fruits and vegetables, whole grains, and beans. Activity  Exercise only as directed by your health care provider. Most women can continue their usual exercise routine during pregnancy. Try to exercise for 30 minutes at least 5 days a week. Stop exercising if you experience uterine contractions.  Avoid heavy lifting, wear low heel shoes, and practice good posture.  A sexual relationship may be continued unless your health care provider directs you otherwise. Relieving pain and discomfort  Wear a good support bra to prevent discomfort from breast tenderness.  Take warm sitz baths to soothe any pain or discomfort caused by hemorrhoids. Use hemorrhoid cream if your health care provider approves.  Rest with your legs elevated if you have leg cramps or low back pain.  If you develop varicose veins, wear support hose. Elevate your feet for 15 minutes, 3-4 times a day. Limit salt in your diet. Prenatal Care  Write down your questions.  Take them to your prenatal visits.  Keep all your prenatal visits as told by your health care provider. This is important. Safety  Wear your seat belt at all times when driving.  Make a list of emergency phone numbers, including numbers for family, friends, the hospital, and police and fire departments. General instructions  Ask your health care provider for a referral to a local prenatal education class. Begin classes no later than the beginning of month 6 of your pregnancy.  Ask for help if you have counseling or nutritional needs during pregnancy. Your health care provider can offer advice or refer you to specialists for help with various needs.  Do not use hot tubs, steam rooms, or saunas.  Do not douche or use tampons or scented sanitary pads.  Do not cross your legs for long periods of time.  Avoid cat litter boxes and soil used by cats. These carry germs that can cause birth defects in the baby and possibly loss of the fetus by miscarriage or stillbirth.  Avoid all smoking, herbs, alcohol, and unprescribed drugs. Chemicals in these products   can affect the formation and growth of the baby.  Do not use any products that contain nicotine or tobacco, such as cigarettes and e-cigarettes. If you need help quitting, ask your health care provider.  Visit your dentist if you have not gone yet during your pregnancy. Use a soft toothbrush to brush your teeth and be gentle when you floss. Contact a health care provider if:  You have dizziness.  You have mild pelvic cramps, pelvic pressure, or nagging pain in the abdominal area.  You have persistent nausea, vomiting, or diarrhea.  You have a bad smelling vaginal discharge.  You have pain when you urinate. Get help right away if:  You have a fever.  You are leaking fluid from your vagina.  You have spotting or bleeding from your vagina.  You have severe abdominal cramping or pain.  You have rapid weight gain or weight  loss.  You have shortness of breath with chest pain.  You notice sudden or extreme swelling of your face, hands, ankles, feet, or legs.  You have not felt your baby move in over an hour.  You have severe headaches that do not go away when you take medicine.  You have vision changes. Summary  The second trimester is from week 14 through week 27 (months 4 through 6). It is also a time when the fetus is growing rapidly.  Your body goes through many changes during pregnancy. The changes vary from woman to woman.  Avoid all smoking, herbs, alcohol, and unprescribed drugs. These chemicals affect the formation and growth your baby.  Do not use any tobacco products, such as cigarettes, chewing tobacco, and e-cigarettes. If you need help quitting, ask your health care provider.  Contact your health care provider if you have any questions. Keep all prenatal visits as told by your health care provider. This is important. This information is not intended to replace advice given to you by your health care provider. Make sure you discuss any questions you have with your health care provider. Document Released: 10/20/2001 Document Revised: 04/02/2016 Document Reviewed: 12/27/2012 Elsevier Interactive Patient Education  2017 [employer]   Breastfeeding Deciding to breastfeed is one of the best choices you can make for you and your baby. A change in hormones during pregnancy causes your breast tissue to grow and increases the number and size of your milk ducts. These hormones also allow proteins, sugars, and fats from your blood supply to make breast milk in your milk-producing glands. Hormones prevent breast milk from being released before your baby is born as well as prompt milk flow after birth. Once breastfeeding has begun, thoughts of your baby, as well as his or her sucking or crying, can stimulate the release of milk from your milk-producing glands. Benefits of breastfeeding For Your  Baby  Your first milk (colostrum) helps your baby's digestive system function better.  There are antibodies in your milk that help your baby fight off infections.  Your baby has a lower incidence of asthma, allergies, and sudden infant death syndrome.  The nutrients in breast milk are better for your baby than infant formulas and are designed uniquely for your baby's needs.  Breast milk improves your baby's brain development.  Your baby is less likely to develop other conditions, such as childhood obesity, asthma, or type 2 diabetes mellitus.  For You  Breastfeeding helps to create a very special bond between you and your baby.  Breastfeeding is convenient. Breast milk is always available at   the correct temperature and costs nothing.  Breastfeeding helps to burn calories and helps you lose the weight gained during pregnancy.  Breastfeeding makes your uterus contract to its prepregnancy size faster and slows bleeding (lochia) after you give birth.  Breastfeeding helps to lower your risk of developing type 2 diabetes mellitus, osteoporosis, and breast or ovarian cancer later in life.  Signs that your baby is hungry Early Signs of Hunger  Increased alertness or activity.  Stretching.  Movement of the head from side to side.  Movement of the head and opening of the mouth when the corner of the mouth or cheek is stroked (rooting).  Increased sucking sounds, smacking lips, cooing, sighing, or squeaking.  Hand-to-mouth movements.  Increased sucking of fingers or hands.  Late Signs of Hunger  Fussing.  Intermittent crying.  Extreme Signs of Hunger Signs of extreme hunger will require calming and consoling before your baby will be able to breastfeed successfully. Do not wait for the following signs of extreme hunger to occur before you initiate breastfeeding:  Restlessness.  A loud, strong cry.  Screaming.  Breastfeeding basics Breastfeeding Initiation  Find a  comfortable place to sit or lie down, with your neck and back well supported.  Place a pillow or rolled up blanket under your baby to bring him or her to the level of your breast (if you are seated). Nursing pillows are specially designed to help support your arms and your baby while you breastfeed.  Make sure that your baby's abdomen is facing your abdomen.  Gently massage your breast. With your fingertips, massage from your chest wall toward your nipple in a circular motion. This encourages milk flow. You may need to continue this action during the feeding if your milk flows slowly.  Support your breast with 4 fingers underneath and your thumb above your nipple. Make sure your fingers are well away from your nipple and your baby's mouth.  Stroke your baby's lips gently with your finger or nipple.  When your baby's mouth is open wide enough, quickly bring your baby to your breast, placing your entire nipple and as much of the colored area around your nipple (areola) as possible into your baby's mouth. ? More areola should be visible above your baby's upper lip than below the lower lip. ? Your baby's tongue should be between his or her lower gum and your breast.  Ensure that your baby's mouth is correctly positioned around your nipple (latched). Your baby's lips should create a seal on your breast and be turned out (everted).  It is common for your baby to suck about 2-3 minutes in order to start the flow of breast milk.  Latching Teaching your baby how to latch on to your breast properly is very important. An improper latch can cause nipple pain and decreased milk supply for you and poor weight gain in your baby. Also, if your baby is not latched onto your nipple properly, he or she may swallow some air during feeding. This can make your baby fussy. Burping your baby when you switch breasts during the feeding can help to get rid of the air. However, teaching your baby to latch on properly is  still the best way to prevent fussiness from swallowing air while breastfeeding. Signs that your baby has successfully latched on to your nipple:  Silent tugging or silent sucking, without causing you pain.  Swallowing heard between every 3-4 sucks.  Muscle movement above and in front of his or her   ears while sucking.  Signs that your baby has not successfully latched on to nipple:  Sucking sounds or smacking sounds from your baby while breastfeeding.  Nipple pain.  If you think your baby has not latched on correctly, slip your finger into the corner of your baby's mouth to break the suction and place it between your baby's gums. Attempt breastfeeding initiation again. Signs of Successful Breastfeeding Signs from your baby:  A gradual decrease in the number of sucks or complete cessation of sucking.  Falling asleep.  Relaxation of his or her body.  Retention of a small amount of milk in his or her mouth.  Letting go of your breast by himself or herself.  Signs from you:  Breasts that have increased in firmness, weight, and size 1-3 hours after feeding.  Breasts that are softer immediately after breastfeeding.  Increased milk volume, as well as a change in milk consistency and color by the fifth day of breastfeeding.  Nipples that are not sore, cracked, or bleeding.  Signs That Your Baby is Getting Enough Milk  Wetting at least 1-2 diapers during the first 24 hours after birth.  Wetting at least 5-6 diapers every 24 hours for the first week after birth. The urine should be clear or pale yellow by 5 days after birth.  Wetting 6-8 diapers every 24 hours as your baby continues to grow and develop.  At least 3 stools in a 24-hour period by age 5 days. The stool should be soft and yellow.  At least 3 stools in a 24-hour period by age 7 days. The stool should be seedy and yellow.  No loss of weight greater than 10% of birth weight during the first 3 days of age.  Average  weight gain of 4-7 ounces (113-198 g) per week after age 4 days.  Consistent daily weight gain by age 5 days, without weight loss after the age of 2 weeks.  After a feeding, your baby may spit up a small amount. This is common. Breastfeeding frequency and duration Frequent feeding will help you make more milk and can prevent sore nipples and breast engorgement. Breastfeed when you feel the need to reduce the fullness of your breasts or when your baby shows signs of hunger. This is called "breastfeeding on demand." Avoid introducing a pacifier to your baby while you are working to establish breastfeeding (the first 4-6 weeks after your baby is born). After this time you may choose to use a pacifier. Research has shown that pacifier use during the first year of a baby's life decreases the risk of sudden infant death syndrome (SIDS). Allow your baby to feed on each breast as long as he or she wants. Breastfeed until your baby is finished feeding. When your baby unlatches or falls asleep while feeding from the first breast, offer the second breast. Because newborns are often sleepy in the first few weeks of life, you may need to awaken your baby to get him or her to feed. Breastfeeding times will vary from baby to baby. However, the following rules can serve as a guide to help you ensure that your baby is properly fed:  Newborns (babies 4 weeks of age or younger) may breastfeed every 1-3 hours.  Newborns should not go longer than 3 hours during the day or 5 hours during the night without breastfeeding.  You should breastfeed your baby a minimum of 8 times in a 24-hour period until you begin to introduce solid foods to your   baby at around 6 months of age.  Breast milk pumping Pumping and storing breast milk allows you to ensure that your baby is exclusively fed your breast milk, even at times when you are unable to breastfeed. This is especially important if you are going back to work while you are still  breastfeeding or when you are not able to be present during feedings. Your lactation consultant can give you guidelines on how long it is safe to store breast milk. A breast pump is a machine that allows you to pump milk from your breast into a sterile bottle. The pumped breast milk can then be stored in a refrigerator or freezer. Some breast pumps are operated by hand, while others use electricity. Ask your lactation consultant which type will work best for you. Breast pumps can be purchased, but some hospitals and breastfeeding support groups lease breast pumps on a monthly basis. A lactation consultant can teach you how to hand express breast milk, if you prefer not to use a pump. Caring for your breasts while you breastfeed Nipples can become dry, cracked, and sore while breastfeeding. The following recommendations can help keep your breasts moisturized and healthy:  Avoid using soap on your nipples.  Wear a supportive bra. Although not required, special nursing bras and tank tops are designed to allow access to your breasts for breastfeeding without taking off your entire bra or top. Avoid wearing underwire-style bras or extremely tight bras.  Air dry your nipples for 3-4minutes after each feeding.  Use only cotton bra pads to absorb leaked breast milk. Leaking of breast milk between feedings is normal.  Use lanolin on your nipples after breastfeeding. Lanolin helps to maintain your skin's normal moisture barrier. If you use pure lanolin, you do not need to wash it off before feeding your baby again. Pure lanolin is not toxic to your baby. You may also hand express a few drops of breast milk and gently massage that milk into your nipples and allow the milk to air dry.  In the first few weeks after giving birth, some women experience extremely full breasts (engorgement). Engorgement can make your breasts feel heavy, warm, and tender to the touch. Engorgement peaks within 3-5 days after you give  birth. The following recommendations can help ease engorgement:  Completely empty your breasts while breastfeeding or pumping. You may want to start by applying warm, moist heat (in the shower or with warm water-soaked hand towels) just before feeding or pumping. This increases circulation and helps the milk flow. If your baby does not completely empty your breasts while breastfeeding, pump any extra milk after he or she is finished.  Wear a snug bra (nursing or regular) or tank top for 1-2 days to signal your body to slightly decrease milk production.  Apply ice packs to your breasts, unless this is too uncomfortable for you.  Make sure that your baby is latched on and positioned properly while breastfeeding.  If engorgement persists after 48 hours of following these recommendations, contact your health care provider or a lactation consultant. Overall health care recommendations while breastfeeding  Eat healthy foods. Alternate between meals and snacks, eating 3 of each per day. Because what you eat affects your breast milk, some of the foods may make your baby more irritable than usual. Avoid eating these foods if you are sure that they are negatively affecting your baby.  Drink milk, fruit juice, and water to satisfy your thirst (about 10 glasses a day).    Rest often, relax, and continue to take your prenatal vitamins to prevent fatigue, stress, and anemia.  Continue breast self-awareness checks.  Avoid chewing and smoking tobacco. Chemicals from cigarettes that pass into breast milk and exposure to secondhand smoke may harm your baby.  Avoid alcohol and drug use, including marijuana. Some medicines that may be harmful to your baby can pass through breast milk. It is important to ask your health care provider before taking any medicine, including all over-the-counter and prescription medicine as well as vitamin and herbal supplements. It is possible to become pregnant while breastfeeding.  If birth control is desired, ask your health care provider about options that will be safe for your baby. Contact a health care provider if:  You feel like you want to stop breastfeeding or have become frustrated with breastfeeding.  You have painful breasts or nipples.  Your nipples are cracked or bleeding.  Your breasts are red, tender, or warm.  You have a swollen area on either breast.  You have a fever or chills.  You have nausea or vomiting.  You have drainage other than breast milk from your nipples.  Your breasts do not become full before feedings by the fifth day after you give birth.  You feel sad and depressed.  Your baby is too sleepy to eat well.  Your baby is having trouble sleeping.  Your baby is wetting less than 3 diapers in a 24-hour period.  Your baby has less than 3 stools in a 24-hour period.  Your baby's skin or the white part of his or her eyes becomes yellow.  Your baby is not gaining weight by 5 days of age. Get help right away if:  Your baby is overly tired (lethargic) and does not want to wake up and feed.  Your baby develops an unexplained fever. This information is not intended to replace advice given to you by your health care provider. Make sure you discuss any questions you have with your health care provider. Document Released: 10/26/2005 Document Revised: 04/08/2016 Document Reviewed: 04/19/2013 Elsevier Interactive Patient Education  2017 [employer]  

## 2017-08-04 NOTE — Progress Notes (Signed)
Patient reports lower back/neck pain.  Patient states she hasn't checked her blood sugars at all really because she has been very overwhelmed and hasn't had time. Patient states she was diagnosed with diabetes at age 26. Checked fasting CBG today, 220. Patient states she didn't eat dinner last night, only had a few bites of cake. Patient states she frequently misses meals but snacks constantly.   Medicaid home form completed

## 2017-08-05 LAB — PROTEIN / CREATININE RATIO, URINE
Creatinine, Urine: 219.7 mg/dL
Protein, Ur: 19.7 mg/dL
Protein/Creat Ratio: 90 mg/g creat (ref 0–200)

## 2017-08-06 LAB — CULTURE, OB URINE

## 2017-08-06 LAB — URINE CULTURE, OB REFLEX

## 2017-08-09 ENCOUNTER — Inpatient Hospital Stay (HOSPITAL_COMMUNITY)
Admission: AD | Admit: 2017-08-09 | Discharge: 2017-08-10 | Disposition: A | Discharge status: Home / Self Care | Payer: Medicaid Other | Source: Ambulatory Visit | Attending: Obstetrics and Gynecology | Admitting: Obstetrics and Gynecology

## 2017-08-09 ENCOUNTER — Encounter: Payer: Self-pay | Admitting: Obstetrics and Gynecology

## 2017-08-09 ENCOUNTER — Encounter (HOSPITAL_COMMUNITY): Payer: Self-pay

## 2017-08-09 DIAGNOSIS — Z79899 Other long term (current) drug therapy: Secondary | ICD-10-CM | POA: Insufficient documentation

## 2017-08-09 DIAGNOSIS — O09299 Supervision of pregnancy with other poor reproductive or obstetric history, unspecified trimester: Secondary | ICD-10-CM | POA: Insufficient documentation

## 2017-08-09 DIAGNOSIS — T887XXA Unspecified adverse effect of drug or medicament, initial encounter: Secondary | ICD-10-CM | POA: Diagnosis not present

## 2017-08-09 DIAGNOSIS — Z794 Long term (current) use of insulin: Secondary | ICD-10-CM | POA: Diagnosis not present

## 2017-08-09 DIAGNOSIS — T383X5A Adverse effect of insulin and oral hypoglycemic [antidiabetic] drugs, initial encounter: Secondary | ICD-10-CM | POA: Insufficient documentation

## 2017-08-09 DIAGNOSIS — Z3A13 13 weeks gestation of pregnancy: Secondary | ICD-10-CM | POA: Diagnosis not present

## 2017-08-09 DIAGNOSIS — O99211 Obesity complicating pregnancy, first trimester: Secondary | ICD-10-CM | POA: Diagnosis not present

## 2017-08-09 DIAGNOSIS — O24112 Pre-existing diabetes mellitus, type 2, in pregnancy, second trimester: Secondary | ICD-10-CM

## 2017-08-09 DIAGNOSIS — O0990 Supervision of high risk pregnancy, unspecified, unspecified trimester: Secondary | ICD-10-CM

## 2017-08-09 DIAGNOSIS — E119 Type 2 diabetes mellitus without complications: Secondary | ICD-10-CM | POA: Insufficient documentation

## 2017-08-09 DIAGNOSIS — O99210 Obesity complicating pregnancy, unspecified trimester: Secondary | ICD-10-CM

## 2017-08-09 DIAGNOSIS — O24111 Pre-existing diabetes mellitus, type 2, in pregnancy, first trimester: Secondary | ICD-10-CM | POA: Insufficient documentation

## 2017-08-09 DIAGNOSIS — O9A211 Injury, poisoning and certain other consequences of external causes complicating pregnancy, first trimester: Secondary | ICD-10-CM | POA: Insufficient documentation

## 2017-08-09 DIAGNOSIS — O169 Unspecified maternal hypertension, unspecified trimester: Secondary | ICD-10-CM

## 2017-08-09 DIAGNOSIS — Z7982 Long term (current) use of aspirin: Secondary | ICD-10-CM | POA: Insufficient documentation

## 2017-08-09 LAB — GLUCOSE, CAPILLARY: Glucose-Capillary: 240 mg/dL — ABNORMAL HIGH (ref 65–99)

## 2017-08-09 NOTE — Telephone Encounter (Signed)
Patient has been notified of her appt. She asked me if I would reschedule her u/s appt. She stated that the original appt was on a Thursday and she works, her day off is Tuesday and she would prefer it be scheduled then. I consulted with MFM as this test is a nuchal translucency and she is 13 weeks today. I was informed they do not have a Tuesday appointment slot but the did have slots on Wed and Fri. I called the patient back and left a message stating the above, and that after this week she would be unable to get this particular test. I asked her to return my call.

## 2017-08-09 NOTE — MAU Note (Addendum)
Pt here with c/o possible allergic after taking insulin. Started vomiting and having burning in the chest. Feels like throat is "a little tight." She was started on insulin today and was told to take her night time dose first; took 11 units NPH tonight.

## 2017-08-10 ENCOUNTER — Ambulatory Visit (HOSPITAL_COMMUNITY)
Admission: RE | Admit: 2017-08-10 | Discharge: 2017-08-10 | Disposition: A | Discharge status: Home / Self Care | Payer: Medicaid Other | Source: Ambulatory Visit | Attending: Family Medicine | Admitting: Family Medicine

## 2017-08-10 ENCOUNTER — Other Ambulatory Visit: Payer: Self-pay | Admitting: Family Medicine

## 2017-08-10 ENCOUNTER — Encounter (HOSPITAL_COMMUNITY): Payer: Self-pay

## 2017-08-10 ENCOUNTER — Encounter: Payer: Self-pay | Admitting: Family Medicine

## 2017-08-10 DIAGNOSIS — Z3A13 13 weeks gestation of pregnancy: Secondary | ICD-10-CM | POA: Diagnosis not present

## 2017-08-10 DIAGNOSIS — O99211 Obesity complicating pregnancy, first trimester: Secondary | ICD-10-CM | POA: Insufficient documentation

## 2017-08-10 DIAGNOSIS — O24111 Pre-existing diabetes mellitus, type 2, in pregnancy, first trimester: Secondary | ICD-10-CM | POA: Diagnosis present

## 2017-08-10 DIAGNOSIS — Z3682 Encounter for antenatal screening for nuchal translucency: Secondary | ICD-10-CM | POA: Insufficient documentation

## 2017-08-10 DIAGNOSIS — O99210 Obesity complicating pregnancy, unspecified trimester: Secondary | ICD-10-CM

## 2017-08-10 DIAGNOSIS — Z794 Long term (current) use of insulin: Secondary | ICD-10-CM

## 2017-08-10 DIAGNOSIS — R87810 Cervical high risk human papillomavirus (HPV) DNA test positive: Secondary | ICD-10-CM

## 2017-08-10 DIAGNOSIS — E119 Type 2 diabetes mellitus without complications: Secondary | ICD-10-CM

## 2017-08-10 DIAGNOSIS — O09891 Supervision of other high risk pregnancies, first trimester: Secondary | ICD-10-CM | POA: Insufficient documentation

## 2017-08-10 DIAGNOSIS — O169 Unspecified maternal hypertension, unspecified trimester: Secondary | ICD-10-CM

## 2017-08-10 DIAGNOSIS — R87610 Atypical squamous cells of undetermined significance on cytologic smear of cervix (ASC-US): Secondary | ICD-10-CM | POA: Insufficient documentation

## 2017-08-10 DIAGNOSIS — O0990 Supervision of high risk pregnancy, unspecified, unspecified trimester: Secondary | ICD-10-CM

## 2017-08-10 LAB — COMPREHENSIVE METABOLIC PANEL
ALBUMIN: 3 g/dL — AB (ref 3.5–5.0)
ALT: 23 U/L (ref 14–54)
ANION GAP: 7 (ref 5–15)
AST: 23 U/L (ref 15–41)
Alkaline Phosphatase: 54 U/L (ref 38–126)
BILIRUBIN TOTAL: 0.3 mg/dL (ref 0.3–1.2)
BUN: 6 mg/dL (ref 6–20)
CALCIUM: 8.8 mg/dL — AB (ref 8.9–10.3)
CO2: 25 mmol/L (ref 22–32)
Chloride: 103 mmol/L (ref 101–111)
Creatinine, Ser: 0.48 mg/dL (ref 0.44–1.00)
GLUCOSE: 201 mg/dL — AB (ref 65–99)
POTASSIUM: 3.6 mmol/L (ref 3.5–5.1)
Sodium: 135 mmol/L (ref 135–145)
TOTAL PROTEIN: 6.6 g/dL (ref 6.5–8.1)

## 2017-08-10 LAB — CBC
HEMATOCRIT: 30.4 % — AB (ref 36.0–46.0)
HEMOGLOBIN: 10.2 g/dL — AB (ref 12.0–15.0)
MCH: 22.3 pg — AB (ref 26.0–34.0)
MCHC: 33.6 g/dL (ref 30.0–36.0)
MCV: 66.4 fL — AB (ref 78.0–100.0)
Platelets: 286 10*3/uL (ref 150–400)
RBC: 4.58 MIL/uL (ref 3.87–5.11)
RDW: 14.8 % (ref 11.5–15.5)
WBC: 9 10*3/uL (ref 4.0–10.5)

## 2017-08-10 LAB — SMN1 COPY NUMBER ANALYSIS (SMA CARRIER SCREENING)

## 2017-08-10 LAB — CYTOLOGY - PAP
DIAGNOSIS: UNDETERMINED — AB
HPV: DETECTED — AB

## 2017-08-10 NOTE — MAU Provider Note (Signed)
Chief Complaint: Allergic Reaction   First Provider Initiated Contact with Patient 08/10/17 0022      SUBJECTIVE HPI: Jennifer Montes is a 26 y.o. G2P1001 at 84w1dby LMP who presents to maternity admissions reporting she took her NPH insulin for the first time tonight and then had chest burning, vomiting x 1, and pain in her right upper thigh. She also reports a sensation after the vomiting of throat tightening that resolved quickly afterwards. The chest burning has also resolved and she denies additional vomiting. She does report some mild nausea. Today was a busy day at work with travelling between 3 different locations and she was unable to heat up her lunch because there was no microwave. So she ate one meal today at 10 pm, before her symptoms started.  There are no other associated symptoms. She denies any abdominal pain or other obstetric complaints. She denies vaginal bleeding, vaginal itching/burning, urinary symptoms, h/a, dizziness, n/v, or fever/chills.     HPI  Past Medical History:  Diagnosis Date  . Diabetes mellitus without complication ([facility]    type2   Past Surgical History:  Procedure Laterality Date  . NO PAST SURGERIES     Social History   Social History  . Marital status: Single    Spouse name: N/A  . Number of children: N/A  . Years of education: N/A   Occupational History  . Not on file.   Social History Main Topics  . Smoking status: Never Smoker  . Smokeless tobacco: Never Used  . Alcohol use No  . Drug use: No  . Sexual activity: Yes    Birth control/ protection: None   Other Topics Concern  . Not on file   Social History Narrative  . No narrative on file   No current facility-administered medications on file prior to encounter.    Current Outpatient Prescriptions on File Prior to Encounter  Medication Sig Dispense Refill  . ACCU-CHEK FASTCLIX LANCETS MISC 1 each by Percutaneous route 4 (four) times daily. Check BS qid; Dx:O24.119 100 each  12  . aspirin EC 81 MG tablet Take 1 tablet (81 mg total) by mouth daily. 90 tablet 3  . Blood Glucose Monitoring Suppl (ACCU-CHEK GUIDE) w/Device KIT 1 Device by Does not apply route 4 (four) times daily. 1 kit 0  . glucose blood (ACCU-CHEK GUIDE) test strip Use as instructed 50 each 12  . insulin aspart (NOVOLOG) 100 UNIT/ML injection Inject 16 Units into the skin 3 (three) times daily before meals. 10 mL 12  . insulin NPH Human (HUMULIN N,NOVOLIN N) 100 UNIT/ML injection Inject 0.11 mLs (11 Units total) into the skin 2 (two) times daily. q am and q hs 10 mL 3  . Insulin Syringe-Needle U-100 (INSULIN SYRINGE 1CC/31GX5/16") 31G X 5/16" 1 ML MISC 1 Units by Does not apply route as needed. 100 each 5  . Prenatal Vit-Fe Fumarate-FA (PRENATAL MULTIVITAMIN) TABS tablet Take 1 tablet by mouth daily at 12 noon.    . docusate sodium (COLACE) 100 MG capsule Take 100 mg by mouth daily as needed for mild constipation.     No Known Allergies  ROS:  Review of Systems  Constitutional: Negative for chills, fatigue and fever.  Respiratory: Positive for chest tightness. Negative for shortness of breath.   Cardiovascular: Negative for chest pain.  Gastrointestinal: Positive for nausea and vomiting.  Genitourinary: Negative for difficulty urinating, dysuria, flank pain, pelvic pain, vaginal bleeding, vaginal discharge and vaginal pain.  Neurological: Negative for dizziness  and headaches.  Psychiatric/Behavioral: Negative.      I have reviewed patient's Past Medical Hx, Surgical Hx, Family Hx, Social Hx, medications and allergies.   Physical Exam   Patient Vitals for the past 24 hrs:  BP Temp Temp src Pulse Resp SpO2 Height Weight  08/10/17 0202 (!) 143/76 98.4 F (36.9 C) Oral 91 18 100 % - -  08/09/17 2304 (!) 147/75 98.3 F (36.8 C) Oral 98 20 100 % 5' 4"  (1.626 m) 220 lb (99.8 kg)   Constitutional: Well-developed, well-nourished female in no acute distress.  Cardiovascular: normal  rate Respiratory: normal effort GI: Abd soft, non-tender. Pos BS x 4 MS: Extremities nontender, no edema, normal ROM Neurologic: Alert and oriented x 4.  GU: Neg CVAT.   FHT 158 by doppler  LAB RESULTS Results for orders placed or performed during the hospital encounter of 08/09/17 (from the past 24 hour(s))  Glucose, capillary     Status: Abnormal   Collection Time: 08/09/17 11:30 PM  Result Value Ref Range   Glucose-Capillary 240 (H) 65 - 99 mg/dL  CBC     Status: Abnormal   Collection Time: 08/10/17  1:02 AM  Result Value Ref Range   WBC 9.0 4.0 - 10.5 K/uL   RBC 4.58 3.87 - 5.11 MIL/uL   Hemoglobin 10.2 (L) 12.0 - 15.0 g/dL   HCT 30.4 (L) 36.0 - 46.0 %   MCV 66.4 (L) 78.0 - 100.0 fL   MCH 22.3 (L) 26.0 - 34.0 pg   MCHC 33.6 30.0 - 36.0 g/dL   RDW 14.8 11.5 - 15.5 %   Platelets 286 150 - 400 K/uL  Comprehensive metabolic panel     Status: Abnormal   Collection Time: 08/10/17  1:02 AM  Result Value Ref Range   Sodium 135 135 - 145 mmol/L   Potassium 3.6 3.5 - 5.1 mmol/L   Chloride 103 101 - 111 mmol/L   CO2 25 22 - 32 mmol/L   Glucose, Bld 201 (H) 65 - 99 mg/dL   BUN 6 6 - 20 mg/dL   Creatinine, Ser 0.48 0.44 - 1.00 mg/dL   Calcium 8.8 (L) 8.9 - 10.3 mg/dL   Total Protein 6.6 6.5 - 8.1 g/dL   Albumin 3.0 (L) 3.5 - 5.0 g/dL   AST 23 15 - 41 U/L   ALT 23 14 - 54 U/L   Alkaline Phosphatase 54 38 - 126 U/L   Total Bilirubin 0.3 0.3 - 1.2 mg/dL   GFR calc non Af Amer >60 >60 mL/min   GFR calc Af Amer >60 >60 mL/min   Anion gap 7 5 - 15    --/--/A POS (08/19 1512)  EKG: normal EKG, normal sinus rhythm.   IMAGING No results found.  MAU Management/MDM: Ordered labs and reviewed results.   CBC, CMP, EKG wnl Pt without symptoms in MAU Likely reaction to skipped meals all day with pt diabetes, and increased activity with moving from store to store, and less likely reaction to NPH Discussed with pt, who agrees but was worried so came to be evaluated Pt to take  tomorrow am dose of NPH, return to MAU or ED if any reaction F/U in office as scheduled Pt stable at time of discharge.  ASSESSMENT 1. Non-dose-related adverse effect of medication, initial encounter   2. Supervision of high risk pregnancy, antepartum   3. Obesity in pregnancy   4. Pregnancy with type 2 diabetes mellitus in second trimester     PLAN  Discharge home Allergies as of 08/10/2017   No Known Allergies     Medication List    TAKE these medications   ACCU-CHEK FASTCLIX LANCETS Misc 1 each by Percutaneous route 4 (four) times daily. Check BS qid; Dx:O24.119   ACCU-CHEK GUIDE w/Device Kit 1 Device by Does not apply route 4 (four) times daily.   aspirin EC 81 MG tablet Take 1 tablet (81 mg total) by mouth daily.   docusate sodium 100 MG capsule Commonly known as:  COLACE Take 100 mg by mouth daily as needed for mild constipation.   glucose blood test strip Commonly known as:  ACCU-CHEK GUIDE Use as instructed   insulin aspart 100 UNIT/ML injection Commonly known as:  novoLOG Inject 16 Units into the skin 3 (three) times daily before meals.   insulin NPH Human 100 UNIT/ML injection Commonly known as:  HUMULIN N,NOVOLIN N Inject 0.11 mLs (11 Units total) into the skin 2 (two) times daily. q am and q hs   INSULIN SYRINGE 1CC/31GX5/16" 31G X 5/16" 1 ML Misc 1 Units by Does not apply route as needed.   prenatal multivitamin Tabs tablet Take 1 tablet by mouth daily at 12 noon.      Follow-up [facility] for [place] Follow up.   Specialty:  Obstetrics and Gynecology Why:  As scheduled, return to MAU as needed for emergencies Contact information: [address] [place] Certified Nurse-Midwife 08/10/2017  2:14 AM

## 2017-08-12 ENCOUNTER — Other Ambulatory Visit: Payer: Self-pay

## 2017-08-12 ENCOUNTER — Ambulatory Visit (HOSPITAL_COMMUNITY): Payer: Medicaid Other

## 2017-08-12 ENCOUNTER — Other Ambulatory Visit (HOSPITAL_COMMUNITY): Payer: Medicaid Other

## 2017-08-16 LAB — OBSTETRIC PANEL, INCLUDING HIV
ANTIBODY SCREEN: NEGATIVE
BASOS: 0 %
Basophils Absolute: 0 10*3/uL (ref 0.0–0.2)
EOS (ABSOLUTE): 0.1 10*3/uL (ref 0.0–0.4)
EOS: 1 %
HEMATOCRIT: 33.6 % — AB (ref 34.0–46.6)
HIV SCREEN 4TH GENERATION: NONREACTIVE
Hemoglobin: 10.9 g/dL — ABNORMAL LOW (ref 11.1–15.9)
Hepatitis B Surface Ag: NEGATIVE
IMMATURE GRANS (ABS): 0 10*3/uL (ref 0.0–0.1)
Immature Granulocytes: 0 %
LYMPHS ABS: 2.3 10*3/uL (ref 0.7–3.1)
Lymphs: 32 %
MCH: 21.9 pg — AB (ref 26.6–33.0)
MCHC: 32.4 g/dL (ref 31.5–35.7)
MCV: 68 fL — AB (ref 79–97)
MONOS ABS: 0.4 10*3/uL (ref 0.1–0.9)
Monocytes: 6 %
Neutrophils Absolute: 4.4 10*3/uL (ref 1.4–7.0)
Neutrophils: 61 %
Platelets: 304 10*3/uL (ref 150–379)
RBC: 4.98 x10E6/uL (ref 3.77–5.28)
RDW: 15.6 % — ABNORMAL HIGH (ref 12.3–15.4)
RPR Ser Ql: NONREACTIVE
RUBELLA: 1.03 {index} (ref >0.99)
Rh Factor: POSITIVE
WBC: 7.2 10*3/uL (ref 3.4–10.8)

## 2017-08-16 LAB — CYSTIC FIBROSIS GENE TEST

## 2017-08-16 LAB — COMPREHENSIVE METABOLIC PANEL
ALBUMIN: 3.7 g/dL (ref 3.5–5.5)
ALT: 33 IU/L — ABNORMAL HIGH (ref 0–32)
AST: 36 IU/L (ref 0–40)
Albumin/Globulin Ratio: 1.3 (ref 1.2–2.2)
Alkaline Phosphatase: 71 IU/L (ref 39–117)
BUN/Creatinine Ratio: 13 (ref 9–23)
BUN: 7 mg/dL (ref 6–20)
Bilirubin Total: 0.3 mg/dL (ref 0.0–1.2)
CALCIUM: 9.3 mg/dL (ref 8.7–10.2)
CO2: 22 mmol/L (ref 20–29)
CREATININE: 0.55 mg/dL — AB (ref 0.57–1.00)
Chloride: 101 mmol/L (ref 96–106)
GFR calc Af Amer: 149 mL/min/{1.73_m2} (ref >59)
GFR, EST NON AFRICAN AMERICAN: 129 mL/min/{1.73_m2} (ref >59)
GLOBULIN, TOTAL: 2.8 g/dL (ref 1.5–4.5)
GLUCOSE: 212 mg/dL — AB (ref 65–99)
Potassium: 4.1 mmol/L (ref 3.5–5.2)
SODIUM: 138 mmol/L (ref 134–144)
Total Protein: 6.5 g/dL (ref 6.0–8.5)

## 2017-08-16 LAB — HEMOGLOBINOPATHY EVALUATION
Ferritin: 211 ng/mL — ABNORMAL HIGH (ref 15–150)
HGB A: 93.4 % — AB (ref 96.4–98.8)
HGB C: 0 %
HGB S: 0 %
HGB VARIANT: 0 %
Hgb A2 Quant: 5.1 % — ABNORMAL HIGH (ref 1.8–3.2)
Hgb F Quant: 1.5 % (ref 0.0–2.0)
Hgb Solubility: NEGATIVE

## 2017-08-16 LAB — ALPHA-THALASSEMIA

## 2017-08-16 LAB — HEMOGLOBIN A1C
Est. average glucose Bld gHb Est-mCnc: 258 mg/dL
Hgb A1c MFr Bld: 10.6 % — ABNORMAL HIGH (ref 4.8–5.6)

## 2017-08-16 LAB — TSH: TSH: 0.939 u[IU]/mL (ref 0.450–4.500)

## 2017-08-20 ENCOUNTER — Encounter: Payer: Medicaid Other | Admitting: Obstetrics and Gynecology

## 2017-08-24 ENCOUNTER — Ambulatory Visit (INDEPENDENT_AMBULATORY_CARE_PROVIDER_SITE_OTHER): Payer: Medicaid Other | Admitting: Family Medicine

## 2017-08-24 VITALS — BP 123/72 | HR 92 | Wt 219.0 lb

## 2017-08-24 DIAGNOSIS — O24112 Pre-existing diabetes mellitus, type 2, in pregnancy, second trimester: Secondary | ICD-10-CM

## 2017-08-24 DIAGNOSIS — O10012 Pre-existing essential hypertension complicating pregnancy, second trimester: Secondary | ICD-10-CM

## 2017-08-24 DIAGNOSIS — O09299 Supervision of pregnancy with other poor reproductive or obstetric history, unspecified trimester: Secondary | ICD-10-CM

## 2017-08-24 DIAGNOSIS — O99210 Obesity complicating pregnancy, unspecified trimester: Secondary | ICD-10-CM

## 2017-08-24 DIAGNOSIS — O0990 Supervision of high risk pregnancy, unspecified, unspecified trimester: Secondary | ICD-10-CM

## 2017-08-24 LAB — GLUCOSE, CAPILLARY: GLUCOSE-CAPILLARY: 138 mg/dL — AB (ref 65–99)

## 2017-08-25 ENCOUNTER — Telehealth: Payer: Self-pay | Admitting: General Practice

## 2017-08-25 ENCOUNTER — Telehealth: Payer: Self-pay

## 2017-08-25 MED ORDER — GLYBURIDE 2.5 MG PO TABS: 2.5000 mg | ORAL_TABLET | Freq: Every day | ORAL | 3 refills | Status: DC

## 2017-08-25 MED ORDER — METFORMIN HCL ER 500 MG PO TB24: ORAL_TABLET | ORAL | 3 refills | Status: DC

## 2017-08-25 NOTE — Telephone Encounter (Signed)
Called patient to inform her that she has an US scheduled for Nov. 13 at 1:00pm and a Fetal echo scheduled for 11/29 at 10:30am. Patient verbalized understanding.

## 2017-08-25 NOTE — Telephone Encounter (Signed)
Called patient & informed her of pap smear results & recommendation. Told patient to discuss with her provider at her 10/30 visit to see if we will do a colpo during pregnancy or wait until postpartum. Patient verbalized understanding & had no questions

## 2017-08-25 NOTE — Progress Notes (Signed)
PRENATAL VISIT NOTE  Subjective:  Jennifer Montes is a 26 y.o. G2P1001 at 6852w2d being seen today for ongoing prenatal care.  She is currently monitored for the following issues for this high-risk pregnancy and has Type 2 diabetes mellitus in pregnancy; Supervision of high risk pregnancy, antepartum; Obesity in pregnancy; Type 2 diabetes mellitus (HCC); Depression; Hx of preeclampsia, prior pregnancy, currently pregnant; Hypertension during pregnancy; and Atypical squamous cell changes of undetermined significance (ASCUS) on cervical cytology with positive high risk human papilloma virus (HPV) on her problem list.  Patient reports no complaints.  Contractions: Not present. Vag. Bleeding: None.  Movement: Absent. Denies leaking of fluid.   Reports she has not been taking her insulin as she developed an allergic reaction when she first took NPH insulin. She not taking Novolog either. Reports checking CBG at hoe and they have been persistently elevated, up to 200s. She reports she is frustrated with her care. Reports she was told on the phone she was having an allergic reaction on insulin, but when she was later seen that day, she was told it wasn't, but she states that she knows she sudden onset of burning on her chest, vomiting, and tightness sensation on her throat just moments after taking taking the insulin. She also reports she plans to transfer care once her other insurance kicks in d/t not liking to see different providers.  The following portions of the patient's history were reviewed and updated as appropriate: allergies, current medications, past family history, past medical history, past social history, past surgical history and problem list. Problem list updated.  Objective:   Vitals:   08/24/17 1418  BP: 123/72  Pulse: 92  Weight: 219 lb (99.3 kg)    Fetal Status: Fetal Heart Rate (bpm): 156   Movement: Absent     General:  Alert, oriented and cooperative. Patient is in no acute  distress.  Skin: Skin is warm and dry. No rash noted.   Cardiovascular: Normal heart rate noted  Respiratory: Normal respiratory effort, no problems with respiration noted  Abdomen: Soft, gravid, appropriate for gestational age.  Pain/Pressure: Present     Pelvic: Cervical exam deferred        Extremities: Normal range of motion.  Edema: None  Mental Status:  Normal mood and affect. Normal behavior. Normal judgment and thought content.   Assessment and Plan:  Pregnancy: G2P1001 at 5552w2d  1. Supervision of high risk pregnancy, antepartum - Initial prenatal labs reviewed - Anatomy scan ordered, US MFM OB DETAIL +14 WK; Future - Plans to transfer care, but will continue care here until insurance change.   2. Pregnancy with type 2 diabetes mellitus in second trimester CBG in the 200s at home. Random, nonfasting CBG 138 here today. Not taking prescribed insulin Hgb A1c was 10.6% on 07/15/2017 - Dicussed importance of tight glycemic control during pregnancy, and discussed treatment options, including resuming insulin (could try Novolin NPH, instead of Humulin since she thinks she had allergic reaction; I explained that reaction wouldn't be to actual insulin), but she rather be on oral agents. Explained that she will need 2 agents and significant dietary modifications, but may still need insulin later in pregnancy. - Start metformin 500 mg daily, instructions given to self titrate to max dose.  - Start glyburide 2.5 mg daily - Monitor CBG, discussed CBG goals (< 95 fasting, and <120 2-hr postprandial) - Follow up in 2 weeks with glucose log - Ambulatory referral to Pediatric Cardiology  3. Hx of  preeclampsia, prior pregnancy, currently pregnant - On ASA  4. Obesity in pregnancy  5. Pre-existing essential hypertension during pregnancy in second trimester - BP wnl, not on meds - Baseline labs drawn on initial PNV - Continue ASA 81 mg daily    Please refer to After Visit Summary for other  counseling recommendations.  Return in about 2 weeks (around 09/07/2017) for [facility], f/u on DM.   Frederik Pear, MD   Future Appointments Date Time Provider Department Center  09/07/2017 12:40 PM Conan Bowens, MD [facility]  09/21/2017 1:00 PM [facility] [place] 3 [facility]

## 2017-08-27 ENCOUNTER — Encounter: Payer: Self-pay | Admitting: *Deleted

## 2017-09-07 ENCOUNTER — Encounter: Payer: Medicaid Other | Admitting: Obstetrics and Gynecology

## 2017-09-09 ENCOUNTER — Encounter: Payer: Medicaid Other | Admitting: Obstetrics and Gynecology

## 2017-09-13 ENCOUNTER — Encounter (HOSPITAL_COMMUNITY): Payer: Self-pay | Admitting: Family Medicine

## 2017-09-21 ENCOUNTER — Ambulatory Visit (HOSPITAL_COMMUNITY)
Admission: RE | Admit: 2017-09-21 | Discharge: 2017-09-21 | Disposition: A | Discharge status: Home / Self Care | Payer: Medicaid Other | Source: Ambulatory Visit | Attending: Family Medicine | Admitting: Family Medicine

## 2017-09-21 ENCOUNTER — Other Ambulatory Visit (HOSPITAL_COMMUNITY): Payer: Self-pay | Admitting: *Deleted

## 2017-09-21 DIAGNOSIS — O24112 Pre-existing diabetes mellitus, type 2, in pregnancy, second trimester: Secondary | ICD-10-CM

## 2017-09-21 DIAGNOSIS — O24119 Pre-existing diabetes mellitus, type 2, in pregnancy, unspecified trimester: Secondary | ICD-10-CM

## 2017-09-21 DIAGNOSIS — Z3A19 19 weeks gestation of pregnancy: Secondary | ICD-10-CM | POA: Insufficient documentation

## 2017-09-23 ENCOUNTER — Telehealth: Payer: Self-pay | Admitting: General Practice

## 2017-09-23 NOTE — Telephone Encounter (Signed)
Patient has not been seen since 10/15 and was debating on changing practices. Called patient, no answer- left message to call us back so we can reschedule your appt. Will send message to front office staff

## 2017-09-29 ENCOUNTER — Encounter: Payer: Medicaid Other | Admitting: Obstetrics and Gynecology

## 2017-09-29 NOTE — Progress Notes (Deleted)
Patient did not show for her return OB appointment. She will be called to reschedule.  

## 2017-10-05 ENCOUNTER — Encounter: Payer: Self-pay | Admitting: Obstetrics and Gynecology

## 2017-10-05 NOTE — Progress Notes (Addendum)
Patient did not show for her return OB appointment 09/29/17. She will be called to reschedule.

## 2017-10-19 ENCOUNTER — Encounter (HOSPITAL_COMMUNITY): Payer: Self-pay

## 2017-10-19 ENCOUNTER — Ambulatory Visit (HOSPITAL_COMMUNITY)
Admission: RE | Admit: 2017-10-19 | Discharge: 2017-10-19 | Disposition: A | Discharge status: Home / Self Care | Payer: Medicaid Other | Source: Ambulatory Visit | Attending: Family Medicine | Admitting: Family Medicine

## 2017-10-21 ENCOUNTER — Inpatient Hospital Stay (HOSPITAL_COMMUNITY)
Admission: AD | Admit: 2017-10-21 | Discharge: 2017-10-21 | Disposition: A | Discharge status: Home / Self Care | Payer: BLUE CROSS/BLUE SHIELD | Source: Ambulatory Visit | Attending: Family Medicine | Admitting: Family Medicine

## 2017-10-21 ENCOUNTER — Encounter (HOSPITAL_COMMUNITY): Payer: Self-pay | Admitting: *Deleted

## 2017-10-21 DIAGNOSIS — R059 Cough, unspecified: Secondary | ICD-10-CM | POA: Diagnosis present

## 2017-10-21 DIAGNOSIS — J029 Acute pharyngitis, unspecified: Secondary | ICD-10-CM | POA: Diagnosis not present

## 2017-10-21 DIAGNOSIS — R87810 Cervical high risk human papillomavirus (HPV) DNA test positive: Secondary | ICD-10-CM

## 2017-10-21 DIAGNOSIS — R05 Cough: Secondary | ICD-10-CM | POA: Insufficient documentation

## 2017-10-21 DIAGNOSIS — O99210 Obesity complicating pregnancy, unspecified trimester: Secondary | ICD-10-CM

## 2017-10-21 DIAGNOSIS — O10012 Pre-existing essential hypertension complicating pregnancy, second trimester: Secondary | ICD-10-CM

## 2017-10-21 DIAGNOSIS — O0990 Supervision of high risk pregnancy, unspecified, unspecified trimester: Secondary | ICD-10-CM

## 2017-10-21 DIAGNOSIS — Z3A23 23 weeks gestation of pregnancy: Secondary | ICD-10-CM | POA: Diagnosis not present

## 2017-10-21 DIAGNOSIS — R87610 Atypical squamous cells of undetermined significance on cytologic smear of cervix (ASC-US): Secondary | ICD-10-CM

## 2017-10-21 DIAGNOSIS — O26892 Other specified pregnancy related conditions, second trimester: Secondary | ICD-10-CM | POA: Insufficient documentation

## 2017-10-21 DIAGNOSIS — Z7982 Long term (current) use of aspirin: Secondary | ICD-10-CM | POA: Insufficient documentation

## 2017-10-21 LAB — INFLUENZA PANEL BY PCR (TYPE A & B)
INFLBPCR: NEGATIVE
Influenza A By PCR: NEGATIVE

## 2017-10-21 LAB — URINALYSIS, ROUTINE W REFLEX MICROSCOPIC
BILIRUBIN URINE: NEGATIVE
Bacteria, UA: NONE SEEN
HGB URINE DIPSTICK: NEGATIVE
Ketones, ur: 5 mg/dL — AB
Nitrite: NEGATIVE
PH: 6 (ref 5.0–8.0)
Protein, ur: 30 mg/dL — AB
SPECIFIC GRAVITY, URINE: 1.022 (ref 1.005–1.030)

## 2017-10-21 LAB — POCT PREGNANCY, URINE: Preg Test, Ur: POSITIVE — AB

## 2017-10-21 LAB — RAPID STREP SCREEN (MED CTR MEBANE ONLY): Streptococcus, Group A Screen (Direct): NEGATIVE

## 2017-10-21 MED ORDER — PHENOL 1.4 % MT LIQD
1.0000 | OROMUCOSAL | Status: DC | PRN
  Administered 2017-10-21: 1 via OROMUCOSAL
  Filled 2017-10-21: qty 177

## 2017-10-21 NOTE — MAU Provider Note (Signed)
History     CSN: [id number]  Arrival date and time: 10/21/17 [id number]   First Provider Initiated Contact with Patient 10/21/17 0911      Chief Complaint  Patient presents with  . Cough  . Abdominal Pain   HPI  Ms. Charleen KirksChynna Toppin is a 26 y.o. G2P1001 at 6663w3d gestation presenting to MAU with complaints of cold-like symptoms (cough, sore throat, chills, nasal congestion, fatigue and weakness) and intermittent abdominal pain. She reports that the cold sx's have been going on since Thanksgiving. She has been drinking "lots of water and orange juice", taking baby aspirin and Tylenol. She "doesn't know what to take".   Past Medical History:  Diagnosis Date  . Diabetes mellitus without complication (HCC)    type2    Past Surgical History:  Procedure Laterality Date  . NO PAST SURGERIES      Family History  Problem Relation Age of Onset  . Diabetes Mother     Social History   Tobacco Use  . Smoking status: Never Smoker  . Smokeless tobacco: Never Used  Substance Use Topics  . Alcohol use: No  . Drug use: No    Allergies: No Known Allergies  Medications Prior to Admission  Medication Sig Dispense Refill Last Dose  . acetaminophen (TYLENOL) 325 MG tablet Take 325 mg by mouth every 6 (six) hours as needed for mild pain.   10/20/2017 at Unknown time  . Prenatal Vit-Fe Fumarate-FA (PRENATAL MULTIVITAMIN) TABS tablet Take 1 tablet by mouth daily at 12 noon.   10/20/2017 at Unknown time  . aspirin EC 81 MG tablet Take 1 tablet (81 mg total) by mouth daily. (Patient not taking: Reported on 09/21/2017) 90 tablet 3 Not Taking at Unknown time  . glyBURIDE (DIABETA) 2.5 MG tablet Take 1 tablet (2.5 mg total) by mouth daily with breakfast. (Patient not taking: Reported on 10/21/2017) 30 tablet 3 Not Taking at Unknown time  . metFORMIN (GLUCOPHAGE XR) 500 MG 24 hr tablet Take by mouth 1 tab daily for 5 days. Then 2 tabs daily for 5 days. Then 2 tabs in the morning, and 2 tabs in the  evening. (Patient not taking: Reported on 10/21/2017) 120 tablet 3 Not Taking at Unknown time    Review of Systems  Constitutional: Negative.   HENT: Positive for sore throat.   Eyes: Negative.   Respiratory: Positive for cough.   Cardiovascular: Negative.   Gastrointestinal: Positive for abdominal pain.  Endocrine: Negative.   Genitourinary: Negative.   Musculoskeletal: Negative.   Skin: Negative.   Allergic/Immunologic: Negative.   Neurological: Negative.   Hematological: Negative.   Psychiatric/Behavioral: Negative.    Physical Exam   Blood pressure 130/73, pulse 99, temperature 98.3 F (36.8 C), resp. rate 18, height 5\' 4"  (1.626 m), weight 227 lb (103 kg), last menstrual period 05/04/2017.  Physical Exam  Nursing note and vitals reviewed. Constitutional: She is oriented to person, place, and time. She appears well-developed and well-nourished.  HENT:  Head: Normocephalic.  Eyes: Pupils are equal, round, and reactive to light.  Neck: Normal range of motion.  Cardiovascular: Normal rate, regular rhythm and normal heart sounds.  Respiratory: Effort normal and breath sounds normal.  GI: Soft. Bowel sounds are normal.  Genitourinary:  Genitourinary Comments: Pelvic deferred  Musculoskeletal: Normal range of motion.  Neurological: She is alert and oriented to person, place, and time.  Skin: Skin is warm and dry.  Psychiatric: She has a normal mood and affect. Her behavior is normal. Judgment  and thought content normal.    MAU Course  Procedures  MDM CCUA Chloraseptic spray -- "my throat feels so much better" Influenza swab Rapid Strep test NST - FHR: 145 bpm / moderate variability / accels present / decels absent / TOCO: some UI noted  Results for orders placed or performed during the hospital encounter of 10/21/17 (from the past 24 hour(s))  Urinalysis, Routine w reflex microscopic     Status: Abnormal   Collection Time: 10/21/17  8:38 AM  Result Value Ref Range    Color, Urine YELLOW YELLOW   APPearance CLOUDY (A) CLEAR   Specific Gravity, Urine 1.022 1.005 - 1.030   pH 6.0 5.0 - 8.0   Glucose, UA >=500 (A) NEGATIVE mg/dL   Hgb urine dipstick NEGATIVE NEGATIVE   Bilirubin Urine NEGATIVE NEGATIVE   Ketones, ur 5 (A) NEGATIVE mg/dL   Protein, ur 30 (A) NEGATIVE mg/dL   Nitrite NEGATIVE NEGATIVE   Leukocytes, UA LARGE (A) NEGATIVE   RBC / HPF 0-5 0 - 5 RBC/hpf   WBC, UA 6-30 0 - 5 WBC/hpf   Bacteria, UA NONE SEEN NONE SEEN   Squamous Epithelial / LPF 6-30 (A) NONE SEEN   Mucus PRESENT    Hyaline Casts, UA PRESENT   Pregnancy, urine POC     Status: Abnormal   Collection Time: 10/21/17  8:53 AM  Result Value Ref Range   Preg Test, Ur POSITIVE (A) NEGATIVE  Rapid strep screen     Status: None   Collection Time: 10/21/17  9:25 AM  Result Value Ref Range   Streptococcus, Group A Screen (Direct) NEGATIVE NEGATIVE  Influenza panel by PCR (type A & B)     Status: None   Collection Time: 10/21/17  9:25 AM  Result Value Ref Range   Influenza A By PCR NEGATIVE NEGATIVE   Influenza B By PCR NEGATIVE NEGATIVE    Assessment and Plan  Sore throat - Continue using Chloraseptic prn - Advised to use throat lozenges prn  Cough - Safe Medication in Pregnancy list given - Continue drinking plenty of fluids, rest and Tylenol   Discharge home Keep scheduled appts Patient verbalized an understanding of the plan of care and agrees.   Raelyn Moraolitta Doy Taaffe, MSN, CNM 10/21/2017, 11:14 AM

## 2017-10-21 NOTE — MAU Note (Signed)
Pt c/o having cold symtoms since Thanksgiveng. Symptoms went awayu for a few days then came bac. C/O cough, chills nasal congestion, feels tired and weak.. Also c/o abd pain that started yesterday. Pain is crampy and comes and goes.

## 2017-10-23 LAB — CULTURE, GROUP A STREP (THRC)

## 2017-11-04 ENCOUNTER — Inpatient Hospital Stay (HOSPITAL_COMMUNITY)
Admission: AD | Admit: 2017-11-04 | Discharge: 2017-11-04 | Disposition: A | Discharge status: Home / Self Care | Payer: BLUE CROSS/BLUE SHIELD | Source: Ambulatory Visit | Attending: Family Medicine | Admitting: Family Medicine

## 2017-11-04 ENCOUNTER — Encounter (HOSPITAL_COMMUNITY): Payer: Self-pay | Admitting: *Deleted

## 2017-11-04 DIAGNOSIS — O2342 Unspecified infection of urinary tract in pregnancy, second trimester: Secondary | ICD-10-CM | POA: Insufficient documentation

## 2017-11-04 DIAGNOSIS — Z9119 Patient's noncompliance with other medical treatment and regimen: Secondary | ICD-10-CM | POA: Diagnosis not present

## 2017-11-04 DIAGNOSIS — E1165 Type 2 diabetes mellitus with hyperglycemia: Secondary | ICD-10-CM | POA: Diagnosis not present

## 2017-11-04 DIAGNOSIS — O24912 Unspecified diabetes mellitus in pregnancy, second trimester: Secondary | ICD-10-CM | POA: Insufficient documentation

## 2017-11-04 DIAGNOSIS — O24112 Pre-existing diabetes mellitus, type 2, in pregnancy, second trimester: Secondary | ICD-10-CM

## 2017-11-04 DIAGNOSIS — O26892 Other specified pregnancy related conditions, second trimester: Secondary | ICD-10-CM | POA: Diagnosis present

## 2017-11-04 DIAGNOSIS — Z3A25 25 weeks gestation of pregnancy: Secondary | ICD-10-CM | POA: Diagnosis not present

## 2017-11-04 DIAGNOSIS — R109 Unspecified abdominal pain: Secondary | ICD-10-CM | POA: Insufficient documentation

## 2017-11-04 DIAGNOSIS — Z91199 Patient's noncompliance with other medical treatment and regimen due to unspecified reason: Secondary | ICD-10-CM

## 2017-11-04 DIAGNOSIS — O09892 Supervision of other high risk pregnancies, second trimester: Secondary | ICD-10-CM | POA: Diagnosis not present

## 2017-11-04 LAB — URINALYSIS, ROUTINE W REFLEX MICROSCOPIC
Bilirubin Urine: NEGATIVE
Hgb urine dipstick: NEGATIVE
Ketones, ur: NEGATIVE mg/dL
NITRITE: NEGATIVE
PH: 6 (ref 5.0–8.0)
Protein, ur: NEGATIVE mg/dL
SPECIFIC GRAVITY, URINE: 1.033 — AB (ref 1.005–1.030)

## 2017-11-04 LAB — WET PREP, GENITAL
CLUE CELLS WET PREP: NONE SEEN
SPERM: NONE SEEN
TRICH WET PREP: NONE SEEN
Yeast Wet Prep HPF POC: NONE SEEN

## 2017-11-04 LAB — GLUCOSE, CAPILLARY: Glucose-Capillary: 189 mg/dL — ABNORMAL HIGH (ref 65–99)

## 2017-11-04 MED ORDER — NITROFURANTOIN MONOHYD MACRO 100 MG PO CAPS: 100.0000 mg | ORAL_CAPSULE | Freq: Two times a day (BID) | ORAL | 0 refills | Status: DC

## 2017-11-04 NOTE — MAU Note (Signed)
Pt presents with c/o ctxs since last night from 2300-0300.  Pt reports ctxs resolved but now feeling pressure.  States pressure began 1700 this evening.  Denies VB or LOF,  Reports increase vaginal mucous.  Reports +FM.

## 2017-11-04 NOTE — MAU Provider Note (Signed)
History     CSN: [id number]  Arrival date and time: 11/04/17 1726   First Provider Initiated Contact with Patient 11/04/17 1840      Chief Complaint  Patient presents with  . Contractions   HPI  Jennifer Montes is a 26 y.o. G2P1001 at 6687w3d who presents with contractions. Symptoms began yesterday. Reports intermittent abdominal pain since last night that she believes is braxton hicks contractions. Can't tell how frequently but isn't happening more than a few times per hour. Also feels suprapubic pressure that is worse after urination. Denies hematuria or dysuria. Denies n/v/d, constipation, dysuria, or vaginal bleeding. No recent intercourse. Increase in vaginal discharge.   Was going to [facility] but has no showed visits since October. Patient is poorly controlled type 2 DM. Was supposed to be on insulin but refused d/t concern for reaction. Was then prescribed glyburide & metformin but states she never started those medications. States she has prenatal care scheduled with [facility] next month & doesn't want to go back to [facility].   OB History    Gravida Para Term Preterm AB Living   2 1 1  0 0 1   SAB TAB Ectopic Multiple Live Births   0 0 0 0 1      Past Medical History:  Diagnosis Date  . Diabetes mellitus without complication (HCC)    type2    Past Surgical History:  Procedure Laterality Date  . NO PAST SURGERIES      Family History  Problem Relation Age of Onset  . Diabetes Mother     Social History   Tobacco Use  . Smoking status: Never Smoker  . Smokeless tobacco: Never Used  Substance Use Topics  . Alcohol use: No  . Drug use: No    Allergies: No Known Allergies  Medications Prior to Admission  Medication Sig Dispense Refill Last Dose  . acetaminophen (TYLENOL) 325 MG tablet Take 325 mg by mouth every 6 (six) hours as needed for mild pain.   10/20/2017 at Unknown time  . aspirin EC 81 MG tablet Take 1 tablet (81 mg total) by mouth daily. (Patient not  taking: Reported on 09/21/2017) 90 tablet 3 Not Taking at Unknown time  . glyBURIDE (DIABETA) 2.5 MG tablet Take 1 tablet (2.5 mg total) by mouth daily with breakfast. (Patient not taking: Reported on 10/21/2017) 30 tablet 3 Not Taking at Unknown time  . metFORMIN (GLUCOPHAGE XR) 500 MG 24 hr tablet Take by mouth 1 tab daily for 5 days. Then 2 tabs daily for 5 days. Then 2 tabs in the morning, and 2 tabs in the evening. (Patient not taking: Reported on 10/21/2017) 120 tablet 3 Not Taking at Unknown time  . Prenatal Vit-Fe Fumarate-FA (PRENATAL MULTIVITAMIN) TABS tablet Take 1 tablet by mouth daily at 12 noon.   10/20/2017 at Unknown time    Review of Systems  Constitutional: Negative.   Gastrointestinal: Positive for abdominal pain. Negative for constipation, diarrhea, nausea and vomiting.  Genitourinary: Positive for vaginal discharge. Negative for dysuria and vaginal bleeding.   Physical Exam   Blood pressure 131/84, pulse 86, temperature 98.6 F (37 C), temperature source Oral, resp. rate 16, height 5\' 4"  (1.626 m), weight 230 lb (104.3 kg), last menstrual period 05/04/2017, SpO2 98 %.  Physical Exam  Nursing note and vitals reviewed. Constitutional: She is oriented to person, place, and time. She appears well-developed and well-nourished. No distress.  HENT:  Head: Normocephalic and atraumatic.  Eyes: Conjunctivae are normal. Right  eye exhibits no discharge. Left eye exhibits no discharge. No scleral icterus.  Neck: Normal range of motion.  Cardiovascular: Normal rate, regular rhythm and normal heart sounds.  No murmur heard. Respiratory: Effort normal and breath sounds normal. No respiratory distress. She has no wheezes.  GI: Soft. There is no tenderness.  Genitourinary: No bleeding in the vagina. Vaginal discharge found.  Genitourinary Comments: Dilation: Closed Exam by:: Judeth HornErin Kailie Polus, NP   Neurological: She is alert and oriented to person, place, and time.  Skin: Skin is warm  and dry. She is not diaphoretic.  Psychiatric: She has a normal mood and affect. Her behavior is normal. Judgment and thought content normal.    MAU Course  Procedures Results for orders placed or performed during the hospital encounter of 11/04/17 (from the past 24 hour(s))  Urinalysis, Routine w reflex microscopic     Status: Abnormal   Collection Time: 11/04/17  5:46 PM  Result Value Ref Range   Color, Urine YELLOW YELLOW   APPearance HAZY (A) CLEAR   Specific Gravity, Urine 1.033 (H) 1.005 - 1.030   pH 6.0 5.0 - 8.0   Glucose, UA >=500 (A) NEGATIVE mg/dL   Hgb urine dipstick NEGATIVE NEGATIVE   Bilirubin Urine NEGATIVE NEGATIVE   Ketones, ur NEGATIVE NEGATIVE mg/dL   Protein, ur NEGATIVE NEGATIVE mg/dL   Nitrite NEGATIVE NEGATIVE   Leukocytes, UA TRACE (A) NEGATIVE   RBC / HPF 0-5 0 - 5 RBC/hpf   WBC, UA 6-30 0 - 5 WBC/hpf   Bacteria, UA FEW (A) NONE SEEN   Squamous Epithelial / LPF 0-5 (A) NONE SEEN  Glucose, capillary     Status: Abnormal   Collection Time: 11/04/17  7:08 PM  Result Value Ref Range   Glucose-Capillary 189 (H) 65 - 99 mg/dL  Wet prep, genital     Status: Abnormal   Collection Time: 11/04/17  7:21 PM  Result Value Ref Range   Yeast Wet Prep HPF POC NONE SEEN NONE SEEN   Trich, Wet Prep NONE SEEN NONE SEEN   Clue Cells Wet Prep HPF POC NONE SEEN NONE SEEN   WBC, Wet Prep HPF POC MODERATE (A) NONE SEEN   Sperm NONE SEEN     MDM NST:  Baseline: 145 bpm, Variability: Good {> 6 bpm), Accelerations: Reactive and Decelerations: Absent Cervix closed & no ctx noted GC/CT & wet prep collected U/a >500 glucose --- CBG ordered -- 189   Assessment and Plan  A: 1. UTI (urinary tract infection) during pregnancy, second trimester   2. [redacted] weeks gestation of pregnancy   3. Noncompliant pregnant patient in second trimester   4. Pregnancy with type 2 diabetes mellitus in second trimester    P: Discharge home Rx macrobid for suspected UTI -- urine culture  pending Stressed importance of blood sugar control & prenatal care --- pt states she will keep appt with [facility]. Discussed reasons to return to MAU  Judeth Hornrin Lomax Poehler 11/04/2017, 6:40 PM

## 2017-11-04 NOTE — Discharge Instructions (Signed)

## 2017-11-04 NOTE — MAU Note (Signed)
Pt states she has been having braxton hicks contractions since last night. States they stopped for a while but since they returned she has been having pressure.

## 2017-11-05 LAB — GC/CHLAMYDIA PROBE AMP (~~LOC~~) NOT AT ARMC
CHLAMYDIA, DNA PROBE: NEGATIVE
NEISSERIA GONORRHEA: NEGATIVE

## 2017-11-06 LAB — CULTURE, OB URINE

## 2017-11-09 NOTE — L&D Delivery Note (Addendum)
Patient: Jennifer Montes MRN: [id number]  GBS status: negative  Patient is a 27 y.o. now G2P2002 s/p NSVD at 3159w5d, who was admitted for IOL for HRFHT and uncontrolled T2DM. S/p IOL with IV Pitocin, and  AROM 13h 2773m prior to delivery with clear fluid. During second stage of labor, FHTs in the 170-180 with deep variables down to the 50s with and without pushing. Dr. Vergie LivingPickens called to the room, and he recommended patient assistance with operative vaginal delivery multiple times, to expedite delivery in the setting of deep fetal heart rate decelerations, but patient declined.  In the next two-three contractions she delivered viable, non vigorous infant that was immediately handed to the awaiting pediatric team; see below  Delivery Note At 7:13 AM a viable female was delivered via Vaginal, Spontaneous (Presentation: vertex;  ROA).   APGAR: 3, 7; weight  3430gm Placenta status: intact, sent to pathology.   Cord: 3-vessel ; Cord pH: 7.03 arterial; 7.135 venous  Head delivered ROA, tight nuchal cord x 1 present, unable to reduce or deliver through, so it was cut at the perineum, after clamping and cutting. Shoulder and body delivered in usual fashion. Infant handed to awaiting neonatal team. Cord blood drawn. Placenta delivered spontaneously with gentle cord traction. Fundus firm with massage and Pitocin. Perineum inspected and found to have no lacerations.  Results for Jennifer ParsonsGILBREATH, Jennifer Montes (MRN [id number]) as of 02/01/2018 09:58  Ref. Range 01/29/2018 07:16 01/29/2018 07:17  pH cord blood (arterial) Latest Ref Range: 7.210 - 7.380  7.034 (LL)   pCO2 cord blood (arterial) Latest Ref Range: 42.0 - 56.0 mmHg 84.9 (H)   Bicarbonate Latest Ref Range: 13.0 - 22.0 mmol/L 21.5 19.0  Ph Cord Blood (Venous) Latest Ref Range: 7.240 - 7.380   7.135 (LL)  pCO2 Cord Blood (Venous) Latest Ref Range: 42.0 - 56.0   58.9 (H)    Anesthesia:  Epidural Episiotomy: None Lacerations: None Suture Repair: none Est.  Blood Loss (mL): 50  Mom to postpartum.  Baby to NICU.  Raynelle FanningJulie P. Degele, MD OB Fellow 01/29/18, 7:34 AM   Agree with above. I was present for the entire procedure.   Cornelia Copaharlie Keionte Swicegood, Jr MD Attending Center for [facility](Faculty Practice)

## 2017-11-15 ENCOUNTER — Ambulatory Visit (INDEPENDENT_AMBULATORY_CARE_PROVIDER_SITE_OTHER): Payer: BLUE CROSS/BLUE SHIELD | Admitting: Family Medicine

## 2017-11-15 VITALS — BP 122/75 | HR 100 | Wt 228.1 lb

## 2017-11-15 DIAGNOSIS — Z23 Encounter for immunization: Secondary | ICD-10-CM

## 2017-11-15 DIAGNOSIS — O0992 Supervision of high risk pregnancy, unspecified, second trimester: Secondary | ICD-10-CM

## 2017-11-15 DIAGNOSIS — O99212 Obesity complicating pregnancy, second trimester: Secondary | ICD-10-CM

## 2017-11-15 DIAGNOSIS — O0990 Supervision of high risk pregnancy, unspecified, unspecified trimester: Secondary | ICD-10-CM

## 2017-11-15 DIAGNOSIS — O10019 Pre-existing essential hypertension complicating pregnancy, unspecified trimester: Secondary | ICD-10-CM

## 2017-11-15 DIAGNOSIS — O24112 Pre-existing diabetes mellitus, type 2, in pregnancy, second trimester: Secondary | ICD-10-CM

## 2017-11-15 DIAGNOSIS — O24119 Pre-existing diabetes mellitus, type 2, in pregnancy, unspecified trimester: Secondary | ICD-10-CM

## 2017-11-15 DIAGNOSIS — O10012 Pre-existing essential hypertension complicating pregnancy, second trimester: Secondary | ICD-10-CM

## 2017-11-15 DIAGNOSIS — O09299 Supervision of pregnancy with other poor reproductive or obstetric history, unspecified trimester: Secondary | ICD-10-CM

## 2017-11-15 DIAGNOSIS — O09292 Supervision of pregnancy with other poor reproductive or obstetric history, second trimester: Secondary | ICD-10-CM

## 2017-11-15 DIAGNOSIS — O99210 Obesity complicating pregnancy, unspecified trimester: Secondary | ICD-10-CM

## 2017-11-15 LAB — POCT URINALYSIS DIP (DEVICE)
Bilirubin Urine: NEGATIVE
Glucose, UA: >=1000 mg/dL — AB
HGB URINE DIPSTICK: NEGATIVE
Leukocytes, UA: NEGATIVE
NITRITE: NEGATIVE
PH: 6 (ref 5.0–8.0)
Protein, ur: NEGATIVE mg/dL
Specific Gravity, Urine: 1.02 (ref 1.005–1.030)
UROBILINOGEN UA: 1 mg/dL (ref 0.0–1.0)

## 2017-11-15 MED ORDER — INSULIN LISPRO 100 UNIT/ML (KWIKPEN): 10.0000 [IU] | PEN_INJECTOR | Freq: Three times a day (TID) | SUBCUTANEOUS | 11 refills | Status: DC

## 2017-11-15 MED ORDER — TETANUS-DIPHTH-ACELL PERTUSSIS 5-2.5-18.5 LF-MCG/0.5 IM SUSP
0.5000 mL | Freq: Once | INTRAMUSCULAR | Status: AC
  Administered 2017-11-15: 0.5 mL via INTRAMUSCULAR

## 2017-11-15 MED ORDER — INSULIN DETEMIR 100 UNIT/ML FLEXPEN: 20.0000 [IU] | PEN_INJECTOR | Freq: Two times a day (BID) | SUBCUTANEOUS | 11 refills | Status: DC

## 2017-11-15 NOTE — Progress Notes (Signed)
Subjective:  Jennifer Montes is a 27 y.o. G2P1001 at 7346w0d being seen today for ongoing prenatal care.  She is currently monitored for the following issues for this high-risk pregnancy and has Type 2 diabetes mellitus in pregnancy; Supervision of high risk pregnancy, antepartum; Obesity in pregnancy; Type 2 diabetes mellitus (HCC); Depression; Hx of preeclampsia, prior pregnancy, currently pregnant; Hypertension during pregnancy; Atypical squamous cell changes of undetermined significance (ASCUS) on cervical cytology with positive high risk human papilloma virus (HPV); Sore throat; and Cough on their problem list.  GDM: Patient diet controlled - not taking metformin or glyburide as prescribed. Last seen here 10/16.  Reports no hypoglycemic episodes.   Fasting: 200 1hr PP: 170-180  Patient reports no complaints. Patient quite upset regarding difficulty in rescheduling appointments at this office, as well as difficulty in reaching secretary. Contractions: Not present. Vag. Bleeding: None.  Movement: Present. Denies leaking of fluid.   The following portions of the patient's history were reviewed and updated as appropriate: allergies, current medications, past family history, past medical history, past social history, past surgical history and problem list. Problem list updated.  Objective:   Vitals:   11/15/17 1532  BP: 122/75  Pulse: 100  Weight: 228 lb 1.6 oz (103.5 kg)    Fetal Status: Fetal Heart Rate (bpm): 162   Movement: Present     General:  Alert, oriented and cooperative. Patient is in no acute distress.  Skin: Skin is warm and dry. No rash noted.   Cardiovascular: Normal heart rate noted  Respiratory: Normal respiratory effort, no problems with respiration noted  Abdomen: Soft, gravid, appropriate for gestational age. Pain/Pressure: Present     Pelvic: Vag. Bleeding: None     Cervical exam deferred        Extremities: Normal range of motion.  Edema: None  Mental Status: Normal  mood and affect. Normal behavior. Normal judgment and thought content.   Urinalysis:      Assessment and Plan:  Pregnancy: G2P1001 at 746w0d  1. Supervision of high risk pregnancy, antepartum FHT and Fh normal. [facility] Patient happy with care today. - CBC - RPR - HIV antibody - Tdap (BOOSTRIX) injection 0.5 mL  2. Type 2 diabetes mellitus during pregnancy, antepartum Pt saw Dr Elizebeth Brookingotton - normal per patient. ROI release signed Discussed compliance with patient Prefers insulin - will change. As had ? Allergic reaction to NPH, will place on levemir and humalog. Weight based dosing: 0.9 unit/kg for weeks 26 to 36. 50% long acting, 50% short acting. Counseled pt to check CBG 1 or 2 hours after eating and to be consistent.  Check HgA1c Growth US tomorrow  - CBC - RPR - HIV antibody - Tdap (BOOSTRIX) injection 0.5 mL - Hemoglobin A1c  3. Obesity in pregnancy - CBC - RPR - HIV antibody - Tdap (BOOSTRIX) injection 0.5 mL  4. Hx of preeclampsia, prior pregnancy, currently pregnant - CBC - RPR - HIV antibody - Tdap (BOOSTRIX) injection 0.5 mL  5. Pre-existing essential hypertension during pregnancy, antepartum BP controlled - CBC - RPR - HIV antibody - Tdap (BOOSTRIX) injection 0.5 mL  Preterm labor symptoms and general obstetric precautions including but not limited to vaginal bleeding, contractions, leaking of fluid and fetal movement were reviewed in detail with the patient. Please refer to After Visit Summary for other counseling recommendations.  No Follow-up on file.   Levie HeritageStinson, Jacob J, DO

## 2017-11-15 NOTE — Progress Notes (Signed)
C/o bruise on leg, not sure how she got it- denies pain.

## 2017-11-16 ENCOUNTER — Ambulatory Visit (HOSPITAL_COMMUNITY): Admission: RE | Admit: 2017-11-16 | Payer: Medicaid Other | Source: Ambulatory Visit

## 2017-11-16 LAB — CBC
HEMATOCRIT: 35.2 % (ref 34.0–46.6)
Hemoglobin: 11.4 g/dL (ref 11.1–15.9)
MCH: 22.1 pg — AB (ref 26.6–33.0)
MCHC: 32.4 g/dL (ref 31.5–35.7)
MCV: 68 fL — AB (ref 79–97)
Platelets: 325 10*3/uL (ref 150–379)
RBC: 5.17 x10E6/uL (ref 3.77–5.28)
RDW: 15.5 % — ABNORMAL HIGH (ref 12.3–15.4)
WBC: 8.5 10*3/uL (ref 3.4–10.8)

## 2017-11-16 LAB — HEMOGLOBIN A1C
ESTIMATED AVERAGE GLUCOSE: 203 mg/dL
Hgb A1c MFr Bld: 8.7 % — ABNORMAL HIGH (ref 4.8–5.6)

## 2017-11-16 LAB — HIV ANTIBODY (ROUTINE TESTING W REFLEX): HIV Screen 4th Generation wRfx: NONREACTIVE

## 2017-11-16 LAB — RPR: RPR Ser Ql: NONREACTIVE

## 2017-11-17 ENCOUNTER — Encounter: Payer: Self-pay | Admitting: *Deleted

## 2017-11-17 ENCOUNTER — Encounter: Payer: Self-pay | Admitting: Family Medicine

## 2017-11-17 ENCOUNTER — Telehealth: Payer: Self-pay | Admitting: *Deleted

## 2017-11-17 NOTE — Telephone Encounter (Signed)
Called Dr.Cotton's office to get results of fetal echo and they informed me she never came for this appointment. We rescheduled for 12/23/17 at 1:30.  I called [facility] and left a message we rescheduled /date/time, call us if questions. Will send [employer] message.

## 2017-11-18 ENCOUNTER — Encounter: Payer: Self-pay | Admitting: *Deleted

## 2017-11-24 ENCOUNTER — Ambulatory Visit (HOSPITAL_COMMUNITY)
Admission: RE | Admit: 2017-11-24 | Payer: BLUE CROSS/BLUE SHIELD | Source: Ambulatory Visit | Attending: Obstetrics and Gynecology | Admitting: Obstetrics and Gynecology

## 2017-11-26 ENCOUNTER — Encounter: Payer: BLUE CROSS/BLUE SHIELD | Admitting: Family Medicine

## 2017-11-30 ENCOUNTER — Other Ambulatory Visit (HOSPITAL_COMMUNITY): Payer: Self-pay | Admitting: Obstetrics and Gynecology

## 2017-11-30 ENCOUNTER — Encounter (HOSPITAL_COMMUNITY): Payer: Self-pay

## 2017-11-30 ENCOUNTER — Ambulatory Visit (HOSPITAL_COMMUNITY)
Admission: RE | Admit: 2017-11-30 | Discharge: 2017-11-30 | Disposition: A | Discharge status: Home / Self Care | Payer: BLUE CROSS/BLUE SHIELD | Source: Ambulatory Visit | Attending: Family Medicine | Admitting: Family Medicine

## 2017-11-30 DIAGNOSIS — R87810 Cervical high risk human papillomavirus (HPV) DNA test positive: Secondary | ICD-10-CM

## 2017-11-30 DIAGNOSIS — O0990 Supervision of high risk pregnancy, unspecified, unspecified trimester: Secondary | ICD-10-CM

## 2017-11-30 DIAGNOSIS — O09299 Supervision of pregnancy with other poor reproductive or obstetric history, unspecified trimester: Secondary | ICD-10-CM | POA: Insufficient documentation

## 2017-11-30 DIAGNOSIS — Z362 Encounter for other antenatal screening follow-up: Secondary | ICD-10-CM | POA: Insufficient documentation

## 2017-11-30 DIAGNOSIS — O24119 Pre-existing diabetes mellitus, type 2, in pregnancy, unspecified trimester: Secondary | ICD-10-CM | POA: Diagnosis present

## 2017-11-30 DIAGNOSIS — R05 Cough: Secondary | ICD-10-CM

## 2017-11-30 DIAGNOSIS — R059 Cough, unspecified: Secondary | ICD-10-CM

## 2017-11-30 DIAGNOSIS — O24113 Pre-existing diabetes mellitus, type 2, in pregnancy, third trimester: Secondary | ICD-10-CM | POA: Diagnosis not present

## 2017-11-30 DIAGNOSIS — Z3A29 29 weeks gestation of pregnancy: Secondary | ICD-10-CM | POA: Diagnosis not present

## 2017-11-30 DIAGNOSIS — R87610 Atypical squamous cells of undetermined significance on cytologic smear of cervix (ASC-US): Secondary | ICD-10-CM

## 2017-11-30 DIAGNOSIS — O10019 Pre-existing essential hypertension complicating pregnancy, unspecified trimester: Secondary | ICD-10-CM

## 2017-11-30 DIAGNOSIS — J029 Acute pharyngitis, unspecified: Secondary | ICD-10-CM

## 2017-11-30 DIAGNOSIS — O99210 Obesity complicating pregnancy, unspecified trimester: Secondary | ICD-10-CM

## 2017-11-30 DIAGNOSIS — O99213 Obesity complicating pregnancy, third trimester: Secondary | ICD-10-CM | POA: Diagnosis not present

## 2017-12-14 ENCOUNTER — Ambulatory Visit (HOSPITAL_COMMUNITY): Admission: RE | Admit: 2017-12-14 | Payer: Medicaid Other | Source: Ambulatory Visit

## 2017-12-20 ENCOUNTER — Other Ambulatory Visit: Payer: Self-pay

## 2017-12-20 ENCOUNTER — Inpatient Hospital Stay (HOSPITAL_COMMUNITY)
Admission: AD | Admit: 2017-12-20 | Discharge: 2017-12-20 | Disposition: A | Discharge status: Home / Self Care | Payer: BLUE CROSS/BLUE SHIELD | Source: Ambulatory Visit | Attending: Obstetrics and Gynecology | Admitting: Obstetrics and Gynecology

## 2017-12-20 ENCOUNTER — Encounter (HOSPITAL_COMMUNITY): Payer: Self-pay

## 2017-12-20 DIAGNOSIS — O99613 Diseases of the digestive system complicating pregnancy, third trimester: Secondary | ICD-10-CM | POA: Insufficient documentation

## 2017-12-20 DIAGNOSIS — A084 Viral intestinal infection, unspecified: Secondary | ICD-10-CM | POA: Insufficient documentation

## 2017-12-20 DIAGNOSIS — Z3A32 32 weeks gestation of pregnancy: Secondary | ICD-10-CM | POA: Insufficient documentation

## 2017-12-20 DIAGNOSIS — Z833 Family history of diabetes mellitus: Secondary | ICD-10-CM | POA: Diagnosis not present

## 2017-12-20 DIAGNOSIS — Z794 Long term (current) use of insulin: Secondary | ICD-10-CM | POA: Insufficient documentation

## 2017-12-20 DIAGNOSIS — E1165 Type 2 diabetes mellitus with hyperglycemia: Secondary | ICD-10-CM | POA: Insufficient documentation

## 2017-12-20 DIAGNOSIS — Z7982 Long term (current) use of aspirin: Secondary | ICD-10-CM | POA: Diagnosis not present

## 2017-12-20 DIAGNOSIS — R739 Hyperglycemia, unspecified: Secondary | ICD-10-CM

## 2017-12-20 DIAGNOSIS — O24113 Pre-existing diabetes mellitus, type 2, in pregnancy, third trimester: Secondary | ICD-10-CM | POA: Insufficient documentation

## 2017-12-20 DIAGNOSIS — R109 Unspecified abdominal pain: Secondary | ICD-10-CM | POA: Diagnosis present

## 2017-12-20 HISTORY — DX: Supervision of pregnancy with other poor reproductive or obstetric history, unspecified trimester: O09.299

## 2017-12-20 LAB — URINALYSIS, ROUTINE W REFLEX MICROSCOPIC
Bilirubin Urine: NEGATIVE
HGB URINE DIPSTICK: NEGATIVE
Ketones, ur: 20 mg/dL — AB
Nitrite: NEGATIVE
Protein, ur: NEGATIVE mg/dL
SPECIFIC GRAVITY, URINE: 1.032 — AB (ref 1.005–1.030)
pH: 6 (ref 5.0–8.0)

## 2017-12-20 LAB — GLUCOSE, CAPILLARY
GLUCOSE-CAPILLARY: 264 mg/dL — AB (ref 65–99)
Glucose-Capillary: 189 mg/dL — ABNORMAL HIGH (ref 65–99)

## 2017-12-20 LAB — COMPREHENSIVE METABOLIC PANEL
ALT: 10 U/L — AB (ref 14–54)
AST: 18 U/L (ref 15–41)
Albumin: 2.8 g/dL — ABNORMAL LOW (ref 3.5–5.0)
Alkaline Phosphatase: 93 U/L (ref 38–126)
Anion gap: 10 (ref 5–15)
BUN: 10 mg/dL (ref 6–20)
CHLORIDE: 103 mmol/L (ref 101–111)
CO2: 18 mmol/L — AB (ref 22–32)
CREATININE: 0.42 mg/dL — AB (ref 0.44–1.00)
Calcium: 8.6 mg/dL — ABNORMAL LOW (ref 8.9–10.3)
GFR calc Af Amer: >60 mL/min (ref >60)
GFR calc non Af Amer: >60 mL/min (ref >60)
Glucose, Bld: 241 mg/dL — ABNORMAL HIGH (ref 65–99)
POTASSIUM: 4 mmol/L (ref 3.5–5.1)
SODIUM: 131 mmol/L — AB (ref 135–145)
Total Bilirubin: 0.7 mg/dL (ref 0.3–1.2)
Total Protein: 6.4 g/dL — ABNORMAL LOW (ref 6.5–8.1)

## 2017-12-20 LAB — CBC
HEMATOCRIT: 36 % (ref 36.0–46.0)
Hemoglobin: 11.9 g/dL — ABNORMAL LOW (ref 12.0–15.0)
MCH: 22.5 pg — AB (ref 26.0–34.0)
MCHC: 33.1 g/dL (ref 30.0–36.0)
MCV: 67.9 fL — AB (ref 78.0–100.0)
PLATELETS: 225 10*3/uL (ref 150–400)
RBC: 5.3 MIL/uL — AB (ref 3.87–5.11)
RDW: 14.6 % (ref 11.5–15.5)
WBC: 6.3 10*3/uL (ref 4.0–10.5)

## 2017-12-20 LAB — FETAL FIBRONECTIN: FETAL FIBRONECTIN: NEGATIVE

## 2017-12-20 LAB — BETA-HYDROXYBUTYRIC ACID: Beta-Hydroxybutyric Acid: 0.66 mmol/L — ABNORMAL HIGH (ref 0.05–0.27)

## 2017-12-20 MED ORDER — ONDANSETRON HCL 4 MG/2ML IJ SOLN
4.0000 mg | Freq: Once | INTRAMUSCULAR | Status: AC
  Administered 2017-12-20: 4 mg via INTRAVENOUS
  Filled 2017-12-20: qty 2

## 2017-12-20 MED ORDER — PROMETHAZINE HCL 25 MG PO TABS: 25.0000 mg | ORAL_TABLET | Freq: Four times a day (QID) | ORAL | 0 refills | Status: DC | PRN

## 2017-12-20 MED ORDER — PROMETHAZINE HCL 25 MG/ML IJ SOLN
12.5000 mg | Freq: Once | INTRAMUSCULAR | Status: AC
  Administered 2017-12-20: 12.5 mg via INTRAVENOUS
  Filled 2017-12-20: qty 1

## 2017-12-20 MED ORDER — SODIUM CHLORIDE 0.9 % IV BOLUS (SEPSIS)
1000.0000 mL | Freq: Once | INTRAVENOUS | Status: AC
  Administered 2017-12-20: 1000 mL via INTRAVENOUS

## 2017-12-20 MED ORDER — FAMOTIDINE IN NACL 20-0.9 MG/50ML-% IV SOLN
20.0000 mg | Freq: Once | INTRAVENOUS | Status: AC
  Administered 2017-12-20: 20 mg via INTRAVENOUS
  Filled 2017-12-20: qty 50

## 2017-12-20 MED ORDER — INSULIN REGULAR HUMAN 100 UNIT/ML IJ SOLN
8.0000 [IU] | Freq: Once | INTRAMUSCULAR | Status: AC
  Administered 2017-12-20: 8 [IU] via SUBCUTANEOUS
  Filled 2017-12-20: qty 1

## 2017-12-20 MED ORDER — ONDANSETRON 4 MG PO TBDP: 4.0000 mg | ORAL_TABLET | Freq: Three times a day (TID) | ORAL | 0 refills | Status: DC | PRN

## 2017-12-20 NOTE — Progress Notes (Signed)
Pt refused to be monitored and took monitors off herself.

## 2017-12-20 NOTE — Progress Notes (Signed)
Monitor adjusted numerous times, Pt will not sit still, now sitting up eating

## 2017-12-20 NOTE — MAU Note (Signed)
Pt refused monitoring at discharge pt did allow me to doppler fetal heart tones, 146 bpm.

## 2017-12-20 NOTE — Discharge Instructions (Signed)
Viral Gastroenteritis, Adult Viral gastroenteritis is also known as the stomach flu. This condition is caused by certain germs (viruses). These germs can be passed from person to person very easily (are very contagious). This condition can cause sudden watery poop (diarrhea), fever, and throwing up (vomiting). Having watery poop and throwing up can make you feel weak and cause you to get dehydrated. Dehydration can make you tired and thirsty, make you have a dry mouth, and make it so you pee (urinate) less often. Older adults and people with other diseases or a weak defense system (immune system) are at higher risk for dehydration. It is important to replace the fluids that you lose from having watery poop and throwing up. Follow these instructions at home: Follow instructions from your doctor about how to care for yourself at home. Eating and drinking  Follow these instructions as told by your doctor:  Take an oral rehydration solution (ORS). This is a drink that is sold at pharmacies and stores.  Drink clear fluids in small amounts as you are able, such as: ? Water. ? Ice chips. ? Diluted fruit juice. ? Low-calorie sports drinks.  Eat bland, easy-to-digest foods in small amounts as you are able, such as: ? Bananas. ? Applesauce. ? Rice. ? Low-fat (lean) meats. ? Toast. ? Crackers.  Avoid fluids that have a lot of sugar or caffeine in them.  Avoid alcohol.  Avoid spicy or fatty foods.  General instructions  Drink enough fluid to keep your pee (urine) clear or pale yellow.  Wash your hands often. If you cannot use soap and water, use hand sanitizer.  Make sure that all people in your home wash their hands well and often.  Rest at home while you get better.  Take over-the-counter and prescription medicines only as told by your doctor.  Watch your condition for any changes.  Take a warm bath to help with any burning or pain from having watery poop.  Keep all follow-up  visits as told by your doctor. This is important. Contact a doctor if:  You cannot keep fluids down.  Your symptoms get worse.  You have new symptoms.  You feel light-headed or dizzy.  You have muscle cramps. Get help right away if:  You have chest pain.  You feel very weak or you pass out (faint).  You see blood in your throw-up.  Your throw-up looks like coffee grounds.  You have bloody or black poop (stools) or poop that look like tar.  You have a very bad headache, a stiff neck, or both.  You have a rash.  You have very bad pain, cramping, or bloating in your belly (abdomen).  You have trouble breathing.  You are breathing very quickly.  Your heart is beating very quickly.  Your skin feels cold and clammy.  You feel confused.  You have pain when you pee.  You have signs of dehydration, such as: ? Dark pee, hardly any pee, or no pee. ? Cracked lips. ? Dry mouth. ? Sunken eyes. ? Sleepiness. ? Weakness. This information is not intended to replace advice given to you by your health care provider. Make sure you discuss any questions you have with your health care provider. Document Released: 04/13/2008 Document Revised: 05/15/2016 Document Reviewed: 07/02/2015 Elsevier Interactive Patient Education  2017 [place].    Type 1 or Type 2 Diabetes Mellitus During Pregnancy, Self Care When you have type 1 or type 2 diabetes (diabetes mellitus), you must keep your  blood sugar (glucose) under control. You can do this with:  Nutrition.  Exercise.  Lifestyle changes.  Insulin or medicines, if needed.  Support from your doctors and others.  How do I manage my blood sugar?  Check your blood sugar every day, as often as told.  Call your doctor if your blood sugar is above your goal numbers for 2 tests in a row.  Have your A1c (hemoglobin A1c) level checked at least two times a year. Have it checked more often if your doctor tells you to do that. Your  doctor will set treatment goals for you. In general, you should have these blood sugar levels:  After not eating for a long time (fasting): 95 mg/dL (5.3 mmol/L).  After meals (postprandial): ? One hour after a meal: at or below 140 mg/dL (7.8 mmol/L). ? Two hours after a meal: at or below 120 mg/dL (6.7 mmol/L).  A1c level: 6-6.5%.  What do I need to know about high blood sugar? High blood sugar is called hyperglycemia. Know the signs of high blood sugar. Signs may include:  Feeling: ? Thirsty. ? Hungry. ? Very tired.  Needing to pee (urinate) more than usual.  Blurry vision.  What do I need to know about low blood sugar? Low blood sugar is called hypoglycemia. This is when blood sugar is at or below 70 mg/dL (3.9 mmol/L). Symptoms may include:  Feeling: ? Hungry. ? Worried or nervous (anxious). ? Sweaty and clammy. ? Confused. ? Dizzy. ? Sleepy. ? Sick to your stomach (nauseous).  Having: ? A fast heartbeat. ? A headache. ? A change in your vision. ? Jerky movements that you cannot control (seizure). ? Nightmares. ? Tingling or no feeling (numbness) around the mouth, lips, or tongue.  Having trouble with: ? Talking. ? Paying attention (concentrating). ? Moving (coordination). ? Sleeping.  Shaking.  Passing out (fainting).  Getting upset easily (irritability).  Treating low blood sugar  To treat low blood sugar, eat or drink something sugary right away. If you can think clearly and swallow safely, follow the 15:15 rule:  Take 15 grams of a fast-acting carb (carbohydrate). Some fast-acting carbs are: ? 1 tube of glucose gel. ? 3 sugar tablets (glucose pills). ? 6-8 pieces of hard candy. ? 4 oz (120 mL) of fruit juice. ? 4 oz (120 mL) regular (not diet) soda.  Check your blood sugar 15 minutes after you take the carb.  If your blood sugar is still at or below 70 mg/dL (3.9 mmol/L), take 15 grams of a carb again.  If your blood sugar does not go  above 70 mg/dL (3.9 mmol/L) after 3 tries, get help right away.  After your blood sugar goes back to normal, eat a meal or a snack within 1 hour.  Treating very low blood sugar If your blood sugar is at or below 54 mg/dL (3 mmol/L), you have very low blood sugar (severe hypoglycemia). This is an emergency. Do not wait to see if the symptoms will go away. Get medical help right away. Call your local emergency services (911 in the [place] Do not drive yourself to the hospital. If you have very low blood sugar and you cannot eat or drink, you may need a glucagon shot (injection). A family member or friend should learn:  How to check your blood sugar.  How to give you a glucagon shot.  Ask your doctor if you need a glucagon shot kit at home. What else is important to  manage my diabetes? Medicine Follow these instructions about insulin and diabetes medicines:  Take them as told by your doctor.  Adjust them as told by your doctor.  Do not run out of them.  Having diabetes can put you at risk for other long-term (chronic) conditions. These may include heart disease and kidney disease. Your doctor may prescribe medicines to help prevent problems from diabetes. Food   Make healthy food choices. These include: ? Chicken, fish, egg whites, and beans. ? Oats, whole wheat, bulgur, brown rice, quinoa, and millet. ? Fresh fruits and vegetables. ? Low-fat dairy products. ? Nuts, avocado, olive oil, and canola oil.  Meet with a food specialist (dietitian) to make an eating plan that is right for you.  Follow instructions from your doctor about what you cannot eat or drink.  Drink enough fluid to keep your pee (urine) clear or pale yellow.  Eat healthy snacks between healthy meals.  Keep track of the carbs you eat. Read food labels. Learn the standard serving sizes of foods.  Follow your sick day plan when you cannot eat or drink normally. Make this plan with your doctor so it is  ready. Activity  Exercise for 30 minutes or more a day during your pregnancy or as much as told by your doctor.  Talk with your doctor before you start a new exercise. Your doctor may need to adjust your insulin, medicines, or food. Lifestyle   Do not drink alcohol.  Do not use any tobacco products, such as cigarettes, chewing tobacco, and e-cigarettes. If you need help quitting, ask your doctor.  Learn how to deal with stress. If you need help with this, ask your doctor. Body care  Stay up to date with your shots (immunizations).  Get an eye exam during your first trimester.  Check your skin and feet every day. Check for cuts, bruises, redness, blisters, or sores.  Get regular foot exams as told by your doctor.  Brush your teeth and gums two times a day. Floss at least one time a day.  Go to the dentist least once every 6 months.  Stay at a healthy weight during your pregnancy. General instructions   Take over-the-counter and prescription medicines only as told by your doctor.  Talk with your doctor about your risk for high blood pressure during pregnancy (preeclampsia or eclampsia).  Share your diabetes care plan with: ? Your work or school. ? People you live with.  Check your pee for ketones: ? When you are sick. ? As told by your doctor.  Carry a card or wear jewelry that says that you have diabetes.  Ask your doctor: ? Do I need to meet with a diabetes educator? ? Where can I find a support group for people with diabetes?  Keep all follow-up visits with your doctor. This is important. Where to find more information: To learn more about diabetes, visit:  American Diabetes Association: www.diabetes.org  American Association of Diabetes Educators (AADE): www.diabeteseducator.org/patient-resources  This information is not intended to replace advice given to you by your health care provider. Make sure you discuss any questions you have with your health care  provider. Document Released: 02/17/2016 Document Revised: 09/09/2016 Document Reviewed: 11/29/2015 Elsevier Interactive Patient Education  2017 [employer].

## 2017-12-20 NOTE — MAU Provider Note (Signed)
History     CSN: [id number]  Arrival date and time: 12/20/17 1035   First Provider Initiated Contact with Patient 12/20/17 1126      Chief Complaint  Patient presents with  . Abdominal Pain  . Emesis   HPI Jennifer Montes is a 27 y.o. G2P1001 at [redacted]w[redacted]d who presents with abdominal pain and vomiting. She is type 2 diabetic on insulin. She has limited prenatal care d/t no shows.  Current symptoms began this morning around 4 am. States she woke up this morning with a sharp burning constant pain in her upper abdomen and started vomiting. Has vomited twice since then & has continued to be nauseated. Now reports intermittent cramp like pain in her LLQ. Denies fever/chills, sick contacts, diarrhea, vaginal bleeding, or LOF. Positive fetal movement.  Took insulin last night. States she did not take her insulin this morning b/c she had not eaten d/t nausea. Felt like her blood sugar was high but did not check her value.   OB History    Gravida Para Term Preterm AB Living   2 1 1  0 0 1   SAB TAB Ectopic Multiple Live Births   0 0 0 0 1      Past Medical History:  Diagnosis Date  . Diabetes mellitus without complication (HCC)    type2  . Hx of preeclampsia, prior pregnancy, currently pregnant     Past Surgical History:  Procedure Laterality Date  . NO PAST SURGERIES      Family History  Problem Relation Age of Onset  . Diabetes Mother     Social History   Tobacco Use  . Smoking status: Never Smoker  . Smokeless tobacco: Never Used  Substance Use Topics  . Alcohol use: No  . Drug use: No    Allergies: No Known Allergies  Medications Prior to Admission  Medication Sig Dispense Refill Last Dose  . acetaminophen (TYLENOL) 325 MG tablet Take 325 mg by mouth every 6 (six) hours as needed for mild pain.   Past Week at Unknown time  . aspirin EC 81 MG tablet Take 1 tablet (81 mg total) by mouth daily. 90 tablet 3 12/19/2017 at Unknown time  . insulin glargine (LANTUS) 100  unit/mL SOPN Inject 11 Units into the skin at bedtime.   12/19/2017 at Unknown time  . insulin lispro (HUMALOG KWIKPEN) 100 UNIT/ML KiwkPen Inject 0.1 mLs (10 Units total) into the skin 3 (three) times daily. 15 mL 11 12/19/2017 at Unknown time  . Prenatal Vit-Fe Fumarate-FA (PRENATAL MULTIVITAMIN) TABS tablet Take 1 tablet by mouth daily at 12 noon.   12/19/2017 at Unknown time  . Insulin Detemir (LEVEMIR FLEXTOUCH) 100 UNIT/ML Pen Inject 20 Units into the skin 2 (two) times daily. (Patient not taking: Reported on 12/20/2017) 15 mL 11 Not Taking at Unknown time    Review of Systems  Constitutional: Positive for chills. Negative for fever.  Gastrointestinal: Positive for abdominal pain, nausea and vomiting. Negative for constipation and diarrhea.  Endocrine: Negative for polydipsia and polyphagia.  Genitourinary: Negative.   Neurological: Negative for headaches.   Physical Exam   Blood pressure (!) 113/57, pulse (!) 106, temperature 97.6 F (36.4 C), temperature source Oral, resp. rate 17, height 5\' 4"  (1.626 m), weight 233 lb (105.7 kg), last menstrual period 05/04/2017, SpO2 99 %.  Physical Exam  Nursing note and vitals reviewed. Constitutional: She is oriented to person, place, and time. She appears well-developed and well-nourished. No distress.  HENT:  Head: Normocephalic  and atraumatic.  Eyes: Conjunctivae are normal. Right eye exhibits no discharge. Left eye exhibits no discharge. No scleral icterus.  Neck: Normal range of motion.  Cardiovascular: Normal rate, regular rhythm and normal heart sounds.  No murmur heard. Respiratory: Effort normal and breath sounds normal. No respiratory distress. She has no wheezes.  GI: Soft. Bowel sounds are normal. There is no tenderness.  Neurological: She is alert and oriented to person, place, and time.  Skin: Skin is warm and dry. She is not diaphoretic.  Psychiatric: She has a normal mood and affect. Her behavior is normal. Judgment and thought  content normal.    MAU Course  Procedures Results for orders placed or performed during the hospital encounter of 12/20/17 (from the past 24 hour(s))  Glucose, capillary     Status: Abnormal   Collection Time: 12/20/17 10:56 AM  Result Value Ref Range   Glucose-Capillary 264 (H) 65 - 99 mg/dL  Urinalysis, Routine w reflex microscopic     Status: Abnormal   Collection Time: 12/20/17 11:17 AM  Result Value Ref Range   Color, Urine YELLOW YELLOW   APPearance HAZY (A) CLEAR   Specific Gravity, Urine 1.032 (H) 1.005 - 1.030   pH 6.0 5.0 - 8.0   Glucose, UA >=500 (A) NEGATIVE mg/dL   Hgb urine dipstick NEGATIVE NEGATIVE   Bilirubin Urine NEGATIVE NEGATIVE   Ketones, ur 20 (A) NEGATIVE mg/dL   Protein, ur NEGATIVE NEGATIVE mg/dL   Nitrite NEGATIVE NEGATIVE   Leukocytes, UA SMALL (A) NEGATIVE   RBC / HPF 0-5 0 - 5 RBC/hpf   WBC, UA 6-30 0 - 5 WBC/hpf   Bacteria, UA RARE (A) NONE SEEN   Squamous Epithelial / LPF 6-30 (A) NONE SEEN  Comprehensive metabolic panel     Status: Abnormal   Collection Time: 12/20/17 11:23 AM  Result Value Ref Range   Sodium 131 (L) 135 - 145 mmol/L   Potassium 4.0 3.5 - 5.1 mmol/L   Chloride 103 101 - 111 mmol/L   CO2 18 (L) 22 - 32 mmol/L   Glucose, Bld 241 (H) 65 - 99 mg/dL   BUN 10 6 - 20 mg/dL   Creatinine, Ser 4.090.42 (L) 0.44 - 1.00 mg/dL   Calcium 8.6 (L) 8.9 - 10.3 mg/dL   Total Protein 6.4 (L) 6.5 - 8.1 g/dL   Albumin 2.8 (L) 3.5 - 5.0 g/dL   AST 18 15 - 41 U/L   ALT 10 (L) 14 - 54 U/L   Alkaline Phosphatase 93 38 - 126 U/L   Total Bilirubin 0.7 0.3 - 1.2 mg/dL   GFR calc non Af Amer >60 >60 mL/min   GFR calc Af Amer >60 >60 mL/min   Anion gap 10 5 - 15  CBC     Status: Abnormal   Collection Time: 12/20/17 11:23 AM  Result Value Ref Range   WBC 6.3 4.0 - 10.5 K/uL   RBC 5.30 (H) 3.87 - 5.11 MIL/uL   Hemoglobin 11.9 (L) 12.0 - 15.0 g/dL   HCT 81.136.0 91.436.0 - 78.246.0 %   MCV 67.9 (L) 78.0 - 100.0 fL   MCH 22.5 (L) 26.0 - 34.0 pg   MCHC 33.1 30.0  - 36.0 g/dL   RDW 95.614.6 21.311.5 - 08.615.5 %   Platelets 225 150 - 400 K/uL  Beta-hydroxybutyric acid     Status: Abnormal   Collection Time: 12/20/17 11:27 AM  Result Value Ref Range   Beta-Hydroxybutyric Acid 0.66 (H) 0.05 - 0.27  mmol/L  Glucose, capillary     Status: Abnormal   Collection Time: 12/20/17  1:31 PM  Result Value Ref Range   Glucose-Capillary 189 (H) 65 - 99 mg/dL  Fetal fibronectin     Status: None   Collection Time: 12/20/17  2:25 PM  Result Value Ref Range   Fetal Fibronectin NEGATIVE NEGATIVE    MDM NST:  Baseline: 150 bpm, Variability: Good {> 6 bpm), Accelerations: Reactive and Decelerations: intermittent tracing & pt refused further monitoring IV NS bolus. Labs pending. Zofran 4 mg IV -- initially reports improvement but states nausea returned so phenergan IV given. No further vomiting while in MAU.  Correction dose of insulin given in MAU (8 U reg insulin) brought CBG down 264>189 Contractions on monitor. Cervix closed. FFN negative. Ctx spaced out.  Intermittent fetal tracing d/t maternal movement. When able to trace baby, category 1 tracing. Ultimately, patient refused fetal monitoring. At time of discharge she reluctantly let the nurse doppler FHTs.  C/w Dr. Jolayne Panther who reviewed labs. Ok to discharge home.    Assessment and Plan  A: 1. Pregnancy with type 2 diabetes mellitus in third trimester   2. Viral gastroenteritis   3. [redacted] weeks gestation of pregnancy   4. Hyperglycemia    P: Discharge home Rx phenergan & zofran Take insulin as prescribed & check BS as instructed Stressed importance of keeping prenatal appointments, especially d/t high risk; states she will call tomorrow to rescheduled the appt she missed at [facility] Discussed reasons to return to MAU  Judeth Horn 12/20/2017, 11:26 AM

## 2017-12-20 NOTE — MAU Note (Signed)
Pt reports sharp pain in her upper abd, vomiting, chills since 4 am. Feels weak.

## 2017-12-22 ENCOUNTER — Encounter: Payer: Self-pay | Admitting: *Deleted

## 2017-12-28 ENCOUNTER — Encounter (HOSPITAL_COMMUNITY): Payer: Self-pay

## 2017-12-28 ENCOUNTER — Ambulatory Visit (INDEPENDENT_AMBULATORY_CARE_PROVIDER_SITE_OTHER): Payer: BLUE CROSS/BLUE SHIELD | Admitting: Advanced Practice Midwife

## 2017-12-28 ENCOUNTER — Other Ambulatory Visit (HOSPITAL_COMMUNITY): Payer: Self-pay | Admitting: *Deleted

## 2017-12-28 ENCOUNTER — Ambulatory Visit (HOSPITAL_COMMUNITY)
Admission: RE | Admit: 2017-12-28 | Discharge: 2017-12-28 | Disposition: A | Discharge status: Home / Self Care | Payer: BLUE CROSS/BLUE SHIELD | Source: Ambulatory Visit | Attending: Family Medicine | Admitting: Family Medicine

## 2017-12-28 VITALS — BP 123/81 | HR 97 | Wt 231.0 lb

## 2017-12-28 DIAGNOSIS — O163 Unspecified maternal hypertension, third trimester: Secondary | ICD-10-CM | POA: Diagnosis present

## 2017-12-28 DIAGNOSIS — Z3A33 33 weeks gestation of pregnancy: Secondary | ICD-10-CM | POA: Diagnosis not present

## 2017-12-28 DIAGNOSIS — O24119 Pre-existing diabetes mellitus, type 2, in pregnancy, unspecified trimester: Secondary | ICD-10-CM

## 2017-12-28 DIAGNOSIS — E119 Type 2 diabetes mellitus without complications: Secondary | ICD-10-CM | POA: Diagnosis not present

## 2017-12-28 DIAGNOSIS — O0990 Supervision of high risk pregnancy, unspecified, unspecified trimester: Secondary | ICD-10-CM

## 2017-12-28 DIAGNOSIS — O99210 Obesity complicating pregnancy, unspecified trimester: Secondary | ICD-10-CM

## 2017-12-28 DIAGNOSIS — O09899 Supervision of other high risk pregnancies, unspecified trimester: Secondary | ICD-10-CM | POA: Diagnosis present

## 2017-12-28 DIAGNOSIS — O09293 Supervision of pregnancy with other poor reproductive or obstetric history, third trimester: Secondary | ICD-10-CM | POA: Diagnosis not present

## 2017-12-28 DIAGNOSIS — O99213 Obesity complicating pregnancy, third trimester: Secondary | ICD-10-CM | POA: Insufficient documentation

## 2017-12-28 DIAGNOSIS — Z9119 Patient's noncompliance with other medical treatment and regimen: Secondary | ICD-10-CM

## 2017-12-28 DIAGNOSIS — O24113 Pre-existing diabetes mellitus, type 2, in pregnancy, third trimester: Secondary | ICD-10-CM

## 2017-12-28 DIAGNOSIS — O09299 Supervision of pregnancy with other poor reproductive or obstetric history, unspecified trimester: Secondary | ICD-10-CM

## 2017-12-28 DIAGNOSIS — Z91199 Patient's noncompliance with other medical treatment and regimen due to unspecified reason: Secondary | ICD-10-CM

## 2017-12-28 DIAGNOSIS — Z789 Other specified health status: Secondary | ICD-10-CM

## 2017-12-28 DIAGNOSIS — O0993 Supervision of high risk pregnancy, unspecified, third trimester: Secondary | ICD-10-CM | POA: Insufficient documentation

## 2017-12-28 MED ORDER — BREAST PUMP MISC: 0 refills | Status: DC

## 2017-12-28 NOTE — Patient Instructions (Addendum)
Increase your Levemir insulin to 25 Units twice a day.   Postpartum Tubal Ligation Postpartum tubal ligation (PPTL) is a procedure to close the fallopian tubes. This is done so that you cannot get pregnant. When the fallopian tubes are closed, the eggs that the ovaries release cannot enter the uterus, and sperm cannot reach the eggs. PPTL is done right after childbirth or 1-2 days after childbirth, before the uterus returns to its normal location. PPTL is sometimes called "getting your tubes tied." You should not have this procedure if you want to get pregnant someday or if you are unsure about having more children. Tell a health care provider about:  Any allergies you have.  All medicines you are taking, including vitamins, herbs, eye drops, creams, and over-the-counter medicines.  Previous problems you or members of your family have had with the use of anesthetics.  Any blood disorders you have.  Previous surgeries you have had.  Any medical conditions you may have.  Any past pregnancies. What are the risks? Generally, this is a safe procedure. However, problems may occur, including:  Infection.  Bleeding.  Injury to surrounding organs.  Side effects from anesthetics.  Failure of the procedure.  This procedure can increase your risk of a kind of pregnancy in which a fertilized egg attaches to the outside of the uterus (ectopic pregnancy). What happens before the procedure?  Ask your health care provider about: ? How much pain you can expect to have. ? What medicines you will be given for pain, especially if you are planning to breastfeed.  Follow instructions from your health care provider about eating and drinking restrictions. What happens during the procedure? If you had a vaginal delivery:  You may be given one or more of the following: ? A medicine that helps you relax (sedative). ? A medicine to numb the area (local anesthetic). ? A medicine to make you fall  asleep (general anesthetic). ? A medicine that is injected into an area of your body to numb everything below the injection site (regional anesthetic).  If you have been given a general anesthetic, a tube will be put down your throat to help you breathe.  An IV tube will be inserted into one of your veins to give you medicines and fluids during the procedure.  Your bladder may be emptied with a small tube (catheter).  An incision will be made just below your belly button.  Your fallopian tubes will be located and brought up through the incision.  Your fallopian tubes will be tied off, burned (cauterized), or blocked with a clip, ring, or clamp. A small portion in the center of each fallopian tube may be removed.  The incision will be closed with stitches (sutures).  A bandage (dressing) will be placed over the incision.  If you had a cesarean delivery:  Tubal ligation will be done through the incision that was used for the cesarean delivery of your baby.  The incision will be closed with sutures.  A dressing will be placed over the incision.  The procedure may vary among health care providers and hospitals. What happens after the procedure?  Your blood pressure, heart rate, breathing rate, and blood oxygen level will be monitored often until the medicines you were given have worn off.  You will be given pain medicine as needed.  Do not drive for 24 hours if you received a sedative. This information is not intended to replace advice given to you by your health care  provider. Make sure you discuss any questions you have with your health care provider. Document Released: 10/26/2005 Document Revised: 03/30/2016 Document Reviewed: 10/06/2015 Elsevier Interactive Patient Education  2018 [employer].   [employer] of the uterus can occur throughout pregnancy, but they are not always a sign that you are in labor. You may have practice contractions called  Braxton Hicks contractions. These false labor contractions are sometimes confused with true labor. What are Deberah Pelton contractions? Braxton Hicks contractions are tightening movements that occur in the muscles of the uterus before labor. Unlike true labor contractions, these contractions do not result in opening (dilation) and thinning of the cervix. Toward the end of pregnancy (32-34 weeks), Braxton Hicks contractions can happen more often and may become stronger. These contractions are sometimes difficult to tell apart from true labor because they can be very uncomfortable. You should not feel embarrassed if you go to the hospital with false labor. Sometimes, the only way to tell if you are in true labor is for your health care provider to look for changes in the cervix. The health care provider will do a physical exam and may monitor your contractions. If you are not in true labor, the exam should show that your cervix is not dilating and your water has not broken. If there are other health problems associated with your pregnancy, it is completely safe for you to be sent home with false labor. You may continue to have Braxton Hicks contractions until you go into true labor. How to tell the difference between true labor and false labor True labor  Contractions last 30-70 seconds.  Contractions become very regular.  Discomfort is usually felt in the top of the uterus, and it spreads to the lower abdomen and low back.  Contractions do not go away with walking.  Contractions usually become more intense and increase in frequency.  The cervix dilates and gets thinner. False labor  Contractions are usually shorter and not as strong as true labor contractions.  Contractions are usually irregular.  Contractions are often felt in the front of the lower abdomen and in the groin.  Contractions may go away when you walk around or change positions while lying down.  Contractions get weaker and  are shorter-lasting as time goes on.  The cervix usually does not dilate or become thin. Follow these instructions at home:  Take over-the-counter and prescription medicines only as told by your health care provider.  Keep up with your usual exercises and follow other instructions from your health care provider.  Eat and drink lightly if you think you are going into labor.  If Braxton Hicks contractions are making you uncomfortable: ? Change your position from lying down or resting to walking, or change from walking to resting. ? Sit and rest in a tub of warm water. ? Drink enough fluid to keep your urine pale yellow. Dehydration may cause these contractions. ? Do slow and deep breathing several times an hour.  Keep all follow-up prenatal visits as told by your health care provider. This is important. Contact a health care provider if:  You have a fever.  You have continuous pain in your abdomen. Get help right away if:  Your contractions become stronger, more regular, and closer together.  You have fluid leaking or gushing from your vagina.  You pass blood-tinged mucus (bloody show).  You have bleeding from your vagina.  You have low back pain that you never had before.  You  feel your baby's head pushing down and causing pelvic pressure.  Your baby is not moving inside you as much as it used to. Summary  Contractions that occur before labor are called Braxton Hicks contractions, false labor, or practice contractions.  Braxton Hicks contractions are usually shorter, weaker, farther apart, and less regular than true labor contractions. True labor contractions usually become progressively stronger and regular and they become more frequent.  Manage discomfort from [facility] contractions by changing position, resting in a warm bath, drinking plenty of water, or practicing deep breathing. This information is not intended to replace advice given to you by your health care  provider. Make sure you discuss any questions you have with your health care provider. Document Released: 03/11/2017 Document Revised: 03/11/2017 Document Reviewed: 03/11/2017 Elsevier Interactive Patient Education  2018 [employer].

## 2017-12-28 NOTE — Progress Notes (Signed)
PRENATAL VISIT NOTE  Subjective:  Jennifer Montes is a 27 y.o. G2P1001 at [redacted]w[redacted]d being seen today for ongoing prenatal care.  She is currently monitored for the following issues for this high-risk pregnancy and has Type 2 diabetes mellitus in pregnancy; Supervision of high risk pregnancy, antepartum; Obesity in pregnancy; Type 2 diabetes mellitus (HCC); Depression; Hx of preeclampsia, prior pregnancy, currently pregnant; Hypertension during pregnancy; Atypical squamous cell changes of undetermined significance (ASCUS) on cervical cytology with positive high risk human papilloma virus (HPV); and Pregnancy complicated by noncompliance, antepartum on their problem list.  Patient reports backache.  Contractions: Not present. Vag. Bleeding: None.  Movement: Present. Denies leaking of fluid.   The following portions of the patient's history were reviewed and updated as appropriate: allergies, current medications, past family history, past medical history, past social history, past surgical history and problem list. Problem list updated.  Has only completed one prenatal visit in the past 4 months. Frequent MAU visits. Had not been taking insulin or PO DM meds for a variety of reasons. Dr. Adrian Blackwater switched her to Levemir and Humalog at last visit. Pt reports taken them w/out adverse reactions, but has very irreg work schedule and therefore irreg meals. Does not always take insulin as scheduled.   Has Nml fetal Echo 12/19/17. Aortic Arch no well visualized, but nml of anatomy US.  States she had Nml eye exam this month.   Objective:   Vitals:   12/28/17 0925  BP: 123/81  Pulse: 97  Weight: 231 lb (104.8 kg)    Fetal Status: Fetal Heart Rate (bpm): 145   Movement: Present     General:  Alert, oriented and cooperative. Patient is in no acute distress.  Skin: Skin is warm and dry. No rash noted.   Cardiovascular: Normal heart rate noted  Respiratory: Normal respiratory effort, no problems with  respiration noted  Abdomen: Soft, gravid, appropriate for gestational age.  Pain/Pressure: Present     Pelvic: Cervical exam deferred        Extremities: Normal range of motion.  Edema: None  Mental Status:  Normal mood and affect. Normal behavior. Normal judgment and thought content.   *Very inconsistent meals and insulin use* Fasting CBGs: up to 180's (100% out of range). Not taking consistently. Inconsistent w/ taking Levemir at night, but still elevated even when she does.  PCB:  120-130 (100% out of range) PCL:  120-130 (100% out of range) PCD: 120-180's (100% out of range)   Assessment and Plan:  Pregnancy: G2P1001 at [redacted]w[redacted]d  1. Pregnancy complicated by noncompliance, antepartum - Lengthy conversation about increased risk of shoulder dystocia and stillbirth with uncontrolled blood sugars. Discussed consistent meals, taking insulin consistently, bringing meter and log to visits. Recommend setting phone alarms. Pt states work schedule makes all of this too hard to do. CNM emphasized that blood sugar control has a major impact on the outcome of this pregnancy for pt and her baby. Pt is planning to start leave from work early and be on a regular schedule.   - [place] MFM FETAL BPP WO NON STRESS; Future - Glucose, random  2. [redacted] weeks gestation of pregnancy  - [place] MFM FETAL BPP WO NON STRESS; Future  3. Hypertension during pregnancy in third trimester, unspecified hypertension in pregnancy type--Nml today  - CTO closely - [place] MFM FETAL BPP WO NON STRESS; Future  4. Supervision of high risk pregnancy, antepartum  - [place] MFM FETAL BPP WO NON STRESS; Future  5. Pregnancy  with type 2 diabetes mellitus in third trimester  - Urged pt to eat and take insulin on regular scheduled so that her providers can adjust insulin appropriately and safely.  - Increase Levemir to 25 BID.  - Start Antenatal testing today. BPP added to growth US today. Explained schedule of Antenatal testing and visits for  remainder of pregnancy.  - US MFM FETAL BPP WO NON STRESS; Future - Glucose, random  6. Hx of preeclampsia, prior pregnancy, currently pregnant  - US MFM FETAL BPP WO NON STRESS; Future  7. Obesity in pregnancy  - US MFM FETAL BPP WO NON STRESS; Future  8. Infant exclusively breastfed  - Misc. Devices (BREAST PUMP) MISC; Dispense one breast pump for patient  Dispense: 1 each; Refill: 0  Preterm labor symptoms and general obstetric precautions including but not limited to vaginal bleeding, contractions, leaking of fluid and fetal movement were reviewed in detail with the patient. Please refer to After Visit Summary for other counseling recommendations.  NST/AFI in 1 week ROB/NST 10 days w/ D.r Adrian BlackwaterStinson.   Dorathy KinsmanVirginia Shirel Mallis, CNM

## 2017-12-29 LAB — GLUCOSE, RANDOM: Glucose: 182 mg/dL — ABNORMAL HIGH (ref 65–99)

## 2017-12-31 ENCOUNTER — Telehealth: Payer: Self-pay

## 2017-12-31 NOTE — Telephone Encounter (Signed)
Patient called stating that last night she had some sharp back pains. She took tylenol and was able to go to sleep for four hours but then woke up and the pain has kept her awake most the night.  Patient is 33.4 weeks. Reports baby moving well.Denies any bleeding or leaking of fluid. Patient made aware that our appointment are full this morning and it would be best for her to go to Maternity Admissions unit for evaluation at this time. Patient states understanding. Armandina StammerJennifer Bailey Kolbe RNBSN

## 2018-01-04 ENCOUNTER — Other Ambulatory Visit: Payer: BLUE CROSS/BLUE SHIELD

## 2018-01-07 ENCOUNTER — Ambulatory Visit (INDEPENDENT_AMBULATORY_CARE_PROVIDER_SITE_OTHER): Payer: BLUE CROSS/BLUE SHIELD | Admitting: Family Medicine

## 2018-01-07 ENCOUNTER — Encounter: Payer: Self-pay | Admitting: Family Medicine

## 2018-01-07 VITALS — BP 119/80 | HR 107 | Wt 234.0 lb

## 2018-01-07 DIAGNOSIS — O09899 Supervision of other high risk pregnancies, unspecified trimester: Secondary | ICD-10-CM

## 2018-01-07 DIAGNOSIS — O10013 Pre-existing essential hypertension complicating pregnancy, third trimester: Secondary | ICD-10-CM

## 2018-01-07 DIAGNOSIS — O24113 Pre-existing diabetes mellitus, type 2, in pregnancy, third trimester: Secondary | ICD-10-CM

## 2018-01-07 DIAGNOSIS — O09893 Supervision of other high risk pregnancies, third trimester: Secondary | ICD-10-CM

## 2018-01-07 DIAGNOSIS — O0990 Supervision of high risk pregnancy, unspecified, unspecified trimester: Secondary | ICD-10-CM

## 2018-01-07 DIAGNOSIS — O09293 Supervision of pregnancy with other poor reproductive or obstetric history, third trimester: Secondary | ICD-10-CM

## 2018-01-07 DIAGNOSIS — R87610 Atypical squamous cells of undetermined significance on cytologic smear of cervix (ASC-US): Secondary | ICD-10-CM

## 2018-01-07 DIAGNOSIS — O09299 Supervision of pregnancy with other poor reproductive or obstetric history, unspecified trimester: Secondary | ICD-10-CM

## 2018-01-07 DIAGNOSIS — O0993 Supervision of high risk pregnancy, unspecified, third trimester: Secondary | ICD-10-CM

## 2018-01-07 DIAGNOSIS — R87810 Cervical high risk human papillomavirus (HPV) DNA test positive: Secondary | ICD-10-CM

## 2018-01-07 DIAGNOSIS — Z9119 Patient's noncompliance with other medical treatment and regimen: Secondary | ICD-10-CM

## 2018-01-07 MED ORDER — INSULIN DETEMIR 100 UNIT/ML FLEXPEN: 30.0000 [IU] | PEN_INJECTOR | Freq: Two times a day (BID) | SUBCUTANEOUS | 11 refills | Status: DC

## 2018-01-07 NOTE — Progress Notes (Signed)
Subjective:  Jennifer KirksChynna Montes is a 27 y.o. G2P1001 at 7455w4d being seen today for ongoing prenatal care.  She is currently monitored for the following issues for this high-risk pregnancy and has Type 2 diabetes mellitus in pregnancy; Supervision of high risk pregnancy, antepartum; Obesity in pregnancy; Type 2 diabetes mellitus (HCC); Depression; Hx of preeclampsia, prior pregnancy, currently pregnant; Hypertension during pregnancy; Atypical squamous cell changes of undetermined significance (ASCUS) on cervical cytology with positive high risk human papilloma virus (HPV); and Pregnancy complicated by noncompliance, antepartum on their problem list.  GDM: Patient taking levemir 25 units BID and lispro 10 units TID.  Reports no hypoglycemic episodes.  Tolerating medication well Fasting: 110-120 Inconsistently checking after eating - sometimes checking blood sugars 3-4 hours after eating. 120 -184  Patient reports no complaints.  Contractions: Irritability. Vag. Bleeding: None.  Movement: Present. Denies leaking of fluid.   The following portions of the patient's history were reviewed and updated as appropriate: allergies, current medications, past family history, past medical history, past social history, past surgical history and problem list. Problem list updated.  Objective:   Vitals:   01/07/18 0936  BP: 119/80  Pulse: (!) 107  Weight: 234 lb (106.1 kg)    Fetal Status: Fetal Heart Rate (bpm): nst Fundal Height: 35 cm Movement: Present  Presentation: Vertex  General:  Alert, oriented and cooperative. Patient is in no acute distress.  Skin: Skin is warm and dry. No rash noted.   Cardiovascular: Normal heart rate noted  Respiratory: Normal respiratory effort, no problems with respiration noted  Abdomen: Soft, gravid, appropriate for gestational age. Pain/Pressure: Present     Pelvic: Vag. Bleeding: None Vag D/C Character: Thin   Cervical exam deferred Dilation: Fingertip Effacement (%): 50  Station: Ballotable  Extremities: Normal range of motion.  Edema: None  Mental Status: Normal mood and affect. Normal behavior. Normal judgment and thought content.   Urinalysis:      Assessment and Plan:  Pregnancy: G2P1001 at 3755w4d  1. Supervision of high risk pregnancy, antepartum FHT normal  2. Pregnancy with type 2 diabetes mellitus in third trimester Discussed compliance with checking blood sugars - patient works as Production designer, theatre/television/filmmanager and has difficulty having time to check. Increase levemir to 30units BID Continue twice weekly testing. US 2/19: EFW 48%     3. Pre-existing essential hypertension during pregnancy in third trimester BP normal Continue ASA 81 daily  4. Hx of preeclampsia, prior pregnancy, currently pregnant Continue ASA 81mg   5. Atypical squamous cell changes of undetermined significance (ASCUS) on cervical cytology with positive high risk human papilloma virus (HPV) Colpo next appt  6. Pregnancy complicated by noncompliance, antepartum   Preterm labor symptoms and general obstetric precautions including but not limited to vaginal bleeding, contractions, leaking of fluid and fetal movement were reviewed in detail with the patient. Please refer to After Visit Summary for other counseling recommendations.  Return for OB f/u, NST tuesday and Friday.   Levie HeritageStinson, Kaleab Frasier J, DO

## 2018-01-07 NOTE — Progress Notes (Signed)
Pt c/o back pain and pain in right leg

## 2018-01-11 ENCOUNTER — Ambulatory Visit (INDEPENDENT_AMBULATORY_CARE_PROVIDER_SITE_OTHER): Payer: BLUE CROSS/BLUE SHIELD

## 2018-01-11 ENCOUNTER — Ambulatory Visit (HOSPITAL_COMMUNITY): Payer: Medicaid Other

## 2018-01-11 VITALS — BP 130/81 | HR 101

## 2018-01-11 DIAGNOSIS — O0990 Supervision of high risk pregnancy, unspecified, unspecified trimester: Secondary | ICD-10-CM

## 2018-01-11 DIAGNOSIS — O24119 Pre-existing diabetes mellitus, type 2, in pregnancy, unspecified trimester: Secondary | ICD-10-CM | POA: Diagnosis not present

## 2018-01-11 NOTE — Progress Notes (Signed)
NST only visit. Jennifer Howard RN 

## 2018-01-11 NOTE — Progress Notes (Signed)
NST reviewed: FHT: 150, moderate with 15x15 accels, no decels Toco: irregular UC, patient is not feeling them Continue 2x weekly testing  Thressa ShellerHeather Hogan 10:46 AM 01/11/18

## 2018-01-14 ENCOUNTER — Other Ambulatory Visit (HOSPITAL_COMMUNITY)
Admission: RE | Admit: 2018-01-14 | Discharge: 2018-01-14 | Disposition: A | Discharge status: Home / Self Care | Payer: BLUE CROSS/BLUE SHIELD | Source: Ambulatory Visit | Attending: Obstetrics & Gynecology | Admitting: Obstetrics & Gynecology

## 2018-01-14 ENCOUNTER — Ambulatory Visit (INDEPENDENT_AMBULATORY_CARE_PROVIDER_SITE_OTHER): Payer: BLUE CROSS/BLUE SHIELD | Admitting: Family Medicine

## 2018-01-14 VITALS — BP 134/82 | HR 91 | Wt 236.0 lb

## 2018-01-14 DIAGNOSIS — O24113 Pre-existing diabetes mellitus, type 2, in pregnancy, third trimester: Secondary | ICD-10-CM

## 2018-01-14 DIAGNOSIS — O09293 Supervision of pregnancy with other poor reproductive or obstetric history, third trimester: Secondary | ICD-10-CM

## 2018-01-14 DIAGNOSIS — Z3A35 35 weeks gestation of pregnancy: Secondary | ICD-10-CM | POA: Diagnosis not present

## 2018-01-14 DIAGNOSIS — O99213 Obesity complicating pregnancy, third trimester: Secondary | ICD-10-CM

## 2018-01-14 DIAGNOSIS — O0993 Supervision of high risk pregnancy, unspecified, third trimester: Secondary | ICD-10-CM

## 2018-01-14 DIAGNOSIS — O0990 Supervision of high risk pregnancy, unspecified, unspecified trimester: Secondary | ICD-10-CM | POA: Diagnosis present

## 2018-01-14 DIAGNOSIS — R87810 Cervical high risk human papillomavirus (HPV) DNA test positive: Secondary | ICD-10-CM

## 2018-01-14 DIAGNOSIS — O10013 Pre-existing essential hypertension complicating pregnancy, third trimester: Secondary | ICD-10-CM

## 2018-01-14 DIAGNOSIS — O09299 Supervision of pregnancy with other poor reproductive or obstetric history, unspecified trimester: Secondary | ICD-10-CM

## 2018-01-14 DIAGNOSIS — O24119 Pre-existing diabetes mellitus, type 2, in pregnancy, unspecified trimester: Secondary | ICD-10-CM | POA: Diagnosis present

## 2018-01-14 DIAGNOSIS — R87610 Atypical squamous cells of undetermined significance on cytologic smear of cervix (ASC-US): Secondary | ICD-10-CM

## 2018-01-14 DIAGNOSIS — O99210 Obesity complicating pregnancy, unspecified trimester: Secondary | ICD-10-CM

## 2018-01-14 MED ORDER — INSULIN DETEMIR 100 UNIT/ML FLEXPEN: 35.0000 [IU] | PEN_INJECTOR | Freq: Two times a day (BID) | SUBCUTANEOUS | 11 refills | Status: DC

## 2018-01-14 MED ORDER — INSULIN DETEMIR 100 UNIT/ML FLEXPEN: 30.0000 [IU] | PEN_INJECTOR | Freq: Two times a day (BID) | SUBCUTANEOUS | 11 refills | Status: DC

## 2018-01-14 NOTE — Progress Notes (Signed)
Subjective:  Jennifer Montes is a 27 y.o. G2P1001 at 5513w4d being seen today for ongoing prenatal care.  She is currently monitored for the following issues for this high-risk pregnancy and has Type 2 diabetes mellitus in pregnancy; Supervision of high risk pregnancy, antepartum; Obesity in pregnancy; Type 2 diabetes mellitus (HCC); Depression; Hx of preeclampsia, prior pregnancy, currently pregnant; Hypertension during pregnancy; Atypical squamous cell changes of undetermined significance (ASCUS) on cervical cytology with positive high risk human papilloma virus (HPV); and Pregnancy complicated by noncompliance, antepartum on their problem list.  GDM: Patient taking levemir 25units BID, lispro 10units TID AC.  Reports no hypoglycemic episodes.  Tolerating medication well. Did not bring meter or log. Reports as: Fasting: 95-110 2hr PP: 120-180  Patient reports no complaints.  Contractions: Irregular. Vag. Bleeding: None.  Movement: Present. Denies leaking of fluid.   The following portions of the patient's history were reviewed and updated as appropriate: allergies, current medications, past family history, past medical history, past social history, past surgical history and problem list. Problem list updated.  Objective:   Vitals:   01/14/18 1001  BP: 134/82  Pulse: 91  Weight: 236 lb (107 kg)    Fetal Status: Fetal Heart Rate (bpm): NST Fundal Height: 36 cm Movement: Present     General:  Alert, oriented and cooperative. Patient is in no acute distress.  Skin: Skin is warm and dry. No rash noted.   Cardiovascular: Normal heart rate noted  Respiratory: Normal respiratory effort, no problems with respiration noted  Abdomen: Soft, gravid, appropriate for gestational age. Pain/Pressure: Present     Pelvic: Vag. Bleeding: None     Cervical exam deferred        Extremities: Normal range of motion.     Mental Status: Normal mood and affect. Normal behavior. Normal judgment and thought  content.   Urinalysis:      Assessment and Plan:  Pregnancy: G2P1001 at 5913w4d  1. Supervision of high risk pregnancy, antepartum GBS today  2. Type 2 diabetes mellitus during pregnancy, antepartum Pt didn't increase levemir as instructed. Will increase to 30 bid.  NST: reactive US for growth 3/19 Stop ASA  3. Hx of preeclampsia, prior pregnancy, currently pregnant BP controlled  4. Pre-existing essential hypertension during pregnancy in third trimester  5. Atypical squamous cell changes of undetermined significance (ASCUS) on cervical cytology with positive high risk human papilloma virus (HPV) Patient uncomofortable with speculum exam - will wait until 6wks PP for colpo.  6. Obesity in pregnancy  Preterm labor symptoms and general obstetric precautions including but not limited to vaginal bleeding, contractions, leaking of fluid and fetal movement were reviewed in detail with the patient. Please refer to After Visit Summary for other counseling recommendations.  No Follow-up on file.   Levie HeritageStinson, Jacob J, DO

## 2018-01-18 ENCOUNTER — Ambulatory Visit (INDEPENDENT_AMBULATORY_CARE_PROVIDER_SITE_OTHER): Payer: BLUE CROSS/BLUE SHIELD

## 2018-01-18 VITALS — BP 130/82 | HR 102

## 2018-01-18 DIAGNOSIS — O24419 Gestational diabetes mellitus in pregnancy, unspecified control: Secondary | ICD-10-CM

## 2018-01-18 DIAGNOSIS — O24119 Pre-existing diabetes mellitus, type 2, in pregnancy, unspecified trimester: Secondary | ICD-10-CM

## 2018-01-18 LAB — GC/CHLAMYDIA PROBE AMP (~~LOC~~) NOT AT ARMC
CHLAMYDIA, DNA PROBE: NEGATIVE
Neisseria Gonorrhea: NEGATIVE

## 2018-01-18 LAB — CULTURE, BETA STREP (GROUP B ONLY): STREP GP B CULTURE: NEGATIVE

## 2018-01-18 NOTE — Progress Notes (Signed)
NST reviewed, reactive Frequent mild contractions Aviva SignsWilliams, Griffon Herberg L, CNM

## 2018-01-18 NOTE — Progress Notes (Signed)
NST only visit. Irfan Veal RNBSN 

## 2018-01-20 ENCOUNTER — Encounter (HOSPITAL_COMMUNITY): Payer: Self-pay | Admitting: *Deleted

## 2018-01-20 ENCOUNTER — Inpatient Hospital Stay (HOSPITAL_COMMUNITY)
Admission: AD | Admit: 2018-01-20 | Discharge: 2018-01-20 | Disposition: A | Discharge status: Home / Self Care | Payer: BLUE CROSS/BLUE SHIELD | Source: Ambulatory Visit | Attending: Obstetrics and Gynecology | Admitting: Obstetrics and Gynecology

## 2018-01-20 DIAGNOSIS — O479 False labor, unspecified: Secondary | ICD-10-CM

## 2018-01-20 DIAGNOSIS — E119 Type 2 diabetes mellitus without complications: Secondary | ICD-10-CM | POA: Diagnosis not present

## 2018-01-20 DIAGNOSIS — Z794 Long term (current) use of insulin: Secondary | ICD-10-CM | POA: Diagnosis not present

## 2018-01-20 DIAGNOSIS — O24113 Pre-existing diabetes mellitus, type 2, in pregnancy, third trimester: Secondary | ICD-10-CM | POA: Diagnosis not present

## 2018-01-20 DIAGNOSIS — Z3A36 36 weeks gestation of pregnancy: Secondary | ICD-10-CM | POA: Insufficient documentation

## 2018-01-20 DIAGNOSIS — O4703 False labor before 37 completed weeks of gestation, third trimester: Secondary | ICD-10-CM

## 2018-01-20 NOTE — MAU Provider Note (Signed)
None     Chief Complaint:  Contractions   Jennifer Montes is  27 y.o. G2P1001 at 8711w3d presents complaining of Contractions .  She states irregular, every 3-5 minutes contractions are associated with none vaginal bleeding, intact membranes, along with active fetal movement. Since around 1730, started getting stronger around 1900.  Lives [place]/4 mile away.   Obstetrical/Gynecological History: OB History    Gravida Para Term Preterm AB Living   2 1 1  0 0 1   SAB TAB Ectopic Multiple Live Births   0 0 0 0 1     Past Medical History: Past Medical History:  Diagnosis Date  . Diabetes mellitus without complication (HCC)    type2  . Hx of preeclampsia, prior pregnancy, currently pregnant     Past Surgical History: Past Surgical History:  Procedure Laterality Date  . NO PAST SURGERIES      Family History: Family History  Problem Relation Age of Onset  . Diabetes Mother     Social History: Social History   Tobacco Use  . Smoking status: Never Smoker  . Smokeless tobacco: Never Used  Substance Use Topics  . Alcohol use: No  . Drug use: No    Allergies: No Known Allergies  Meds:  Medications Prior to Admission  Medication Sig Dispense Refill Last Dose  . Insulin Detemir (LEVEMIR FLEXTOUCH) 100 UNIT/ML Pen Inject 30 Units into the skin 2 (two) times daily. 15 mL 11 01/20/2018 at Unknown time  . insulin lispro (HUMALOG KWIKPEN) 100 UNIT/ML KiwkPen Inject 0.1 mLs (10 Units total) into the skin 3 (three) times daily. 15 mL 11 01/20/2018 at Unknown time  . Prenatal Vit-Fe Fumarate-FA (PRENATAL MULTIVITAMIN) TABS tablet Take 1 tablet by mouth daily at 12 noon.   01/20/2018 at Unknown time  . acetaminophen (TYLENOL) 325 MG tablet Take 325 mg by mouth every 6 (six) hours as needed for mild pain.   Taking    Review of Systems   Constitutional: Negative for fever and chills Eyes: Negative for visual disturbances Respiratory: Negative for shortness of breath,  dyspnea Cardiovascular: Negative for chest pain or palpitations  Gastrointestinal: Negative for vomiting, diarrhea and constipation Genitourinary: Negative for dysuria and urgency Musculoskeletal: Negative for back pain, joint pain, myalgias.  Normal ROM  Neurological: Negative for dizziness and headaches    Physical Exam  Blood pressure 132/85, pulse 94, temperature 98.4 F (36.9 C), resp. rate 20, height 5\' 4"  (1.626 m), weight 109.7 kg (241 lb 12 oz), last menstrual period 05/04/2017. GENERAL: Well-developed, well-nourished female in no acute distress.  LUNGS: Clear to auscultation bilaterally.  HEART: Regular rate and rhythm. ABDOMEN: Soft, nontender, nondistended, gravid.  EXTREMITIES: Nontender, no edema, 2+ distal pulses. DTR's 2+ CERVICAL EXAM: Dilatation 1.5cm   Effacement 60%   Station -2   Presentation: cephalic FHT:  Baseline rate 150 bpm   Variability moderate  Accelerations present   Decelerations none Contractions: Every 3-5 mins   Labs: No results found for this or any previous visit (from the past 24 hour(s)). Imaging Studies:    Assessment: Jennifer KirksChynna Montes is  27 y.o. G2P1001 at 7411w3d presents with early vs false labor  Plan: DC home.  Come back when ctx have been consistently getting stronger for a few more hours  Jacklyn ShellFrances Cresenzo-Dishmon 3/14/201911:06 PM

## 2018-01-20 NOTE — MAU Note (Signed)
CTX started around 1730.  Has gotten worse-now every 2-3 mins.  Reports no LOF/VB.  Good FM.  Last VE on Tuesday-closed.

## 2018-01-20 NOTE — Discharge Instructions (Signed)
Braxton Hicks Contractions °Contractions of the uterus can occur throughout pregnancy, but they are not always a sign that you are in labor. You may have practice contractions called Braxton Hicks contractions. These false labor contractions are sometimes confused with true labor. °What are Braxton Hicks contractions? °Braxton Hicks contractions are tightening movements that occur in the muscles of the uterus before labor. Unlike true labor contractions, these contractions do not result in opening (dilation) and thinning of the cervix. Toward the end of pregnancy (32-34 weeks), Braxton Hicks contractions can happen more often and may become stronger. These contractions are sometimes difficult to tell apart from true labor because they can be very uncomfortable. You should not feel embarrassed if you go to the hospital with false labor. °Sometimes, the only way to tell if you are in true labor is for your health care provider to look for changes in the cervix. The health care provider will do a physical exam and may monitor your contractions. If you are not in true labor, the exam should show that your cervix is not dilating and your water has not broken. °If there are other health problems associated with your pregnancy, it is completely safe for you to be sent home with false labor. You may continue to have Braxton Hicks contractions until you go into true labor. °How to tell the difference between true labor and false labor °True labor °· Contractions last 30-70 seconds. °· Contractions become very regular. °· Discomfort is usually felt in the top of the uterus, and it spreads to the lower abdomen and low back. °· Contractions do not go away with walking. °· Contractions usually become more intense and increase in frequency. °· The cervix dilates and gets thinner. °False labor °· Contractions are usually shorter and not as strong as true labor contractions. °· Contractions are usually irregular. °· Contractions  are often felt in the front of the lower abdomen and in the groin. °· Contractions may go away when you walk around or change positions while lying down. °· Contractions get weaker and are shorter-lasting as time goes on. °· The cervix usually does not dilate or become thin. °Follow these instructions at home: °· Take over-the-counter and prescription medicines only as told by your health care provider. °· Keep up with your usual exercises and follow other instructions from your health care provider. °· Eat and drink lightly if you think you are going into labor. °· If Braxton Hicks contractions are making you uncomfortable: °? Change your position from lying down or resting to walking, or change from walking to resting. °? Sit and rest in a tub of warm water. °? Drink enough fluid to keep your urine pale yellow. Dehydration may cause these contractions. °? Do slow and deep breathing several times an hour. °· Keep all follow-up prenatal visits as told by your health care provider. This is important. °Contact a health care provider if: °· You have a fever. °· You have continuous pain in your abdomen. °Get help right away if: °· Your contractions become stronger, more regular, and closer together. °· You have fluid leaking or gushing from your vagina. °· You pass blood-tinged mucus (bloody show). °· You have bleeding from your vagina. °· You have low back pain that you never had before. °· You feel your baby’s head pushing down and causing pelvic pressure. °· Your baby is not moving inside you as much as it used to. °Summary °· Contractions that occur before labor are called Braxton   Hicks contractions, false labor, or practice contractions. °· Braxton Hicks contractions are usually shorter, weaker, farther apart, and less regular than true labor contractions. True labor contractions usually become progressively stronger and regular and they become more frequent. °· Manage discomfort from Braxton Hicks contractions by  changing position, resting in a warm bath, drinking plenty of water, or practicing deep breathing. °This information is not intended to replace advice given to you by your health care provider. Make sure you discuss any questions you have with your health care provider. °Document Released: 03/11/2017 Document Revised: 03/11/2017 Document Reviewed: 03/11/2017 °Elsevier Interactive Patient Education © 2018 [employer] ° °

## 2018-01-20 NOTE — MAU Note (Signed)
PT SAYS UC  STARTED AT 530PM- STRONGER SINCE 7 PM.   DENIES HSV AND  MRSA.  GBS- NEG

## 2018-01-21 ENCOUNTER — Ambulatory Visit (INDEPENDENT_AMBULATORY_CARE_PROVIDER_SITE_OTHER): Payer: BLUE CROSS/BLUE SHIELD | Admitting: Obstetrics & Gynecology

## 2018-01-21 VITALS — BP 135/93 | HR 92 | Wt 236.0 lb

## 2018-01-21 DIAGNOSIS — Z9119 Patient's noncompliance with other medical treatment and regimen: Secondary | ICD-10-CM

## 2018-01-21 DIAGNOSIS — O10013 Pre-existing essential hypertension complicating pregnancy, third trimester: Secondary | ICD-10-CM

## 2018-01-21 DIAGNOSIS — O09299 Supervision of pregnancy with other poor reproductive or obstetric history, unspecified trimester: Secondary | ICD-10-CM

## 2018-01-21 DIAGNOSIS — O09899 Supervision of other high risk pregnancies, unspecified trimester: Secondary | ICD-10-CM

## 2018-01-21 DIAGNOSIS — O09293 Supervision of pregnancy with other poor reproductive or obstetric history, third trimester: Secondary | ICD-10-CM

## 2018-01-21 DIAGNOSIS — O24113 Pre-existing diabetes mellitus, type 2, in pregnancy, third trimester: Secondary | ICD-10-CM | POA: Diagnosis not present

## 2018-01-21 DIAGNOSIS — O0990 Supervision of high risk pregnancy, unspecified, unspecified trimester: Secondary | ICD-10-CM

## 2018-01-21 DIAGNOSIS — O0993 Supervision of high risk pregnancy, unspecified, third trimester: Secondary | ICD-10-CM

## 2018-01-21 DIAGNOSIS — O99210 Obesity complicating pregnancy, unspecified trimester: Secondary | ICD-10-CM

## 2018-01-21 DIAGNOSIS — F320 Major depressive disorder, single episode, mild: Secondary | ICD-10-CM

## 2018-01-21 DIAGNOSIS — O99213 Obesity complicating pregnancy, third trimester: Secondary | ICD-10-CM

## 2018-01-21 NOTE — Progress Notes (Addendum)
   PRENATAL VISIT NOTE  Subjective:  Jennifer Montes is a 27 y.o. G2P1001 at 2597w4d being seen today for ongoing prenatal care.  She is currently monitored for the following issues for this high-risk pregnancy and has Type 2 diabetes mellitus in pregnancy; Supervision of high risk pregnancy, antepartum; Obesity in pregnancy; Type 2 diabetes mellitus (HCC); Depression; Hx of preeclampsia, prior pregnancy, currently pregnant; Hypertension during pregnancy; Atypical squamous cell changes of undetermined significance (ASCUS) on cervical cytology with positive high risk human papilloma virus (HPV); and Pregnancy complicated by noncompliance, antepartum on their problem list.  Patient reports pt reports reg contractions. .  Contractions: Irregular. Vag. Bleeding: None.  Movement: Present. Denies leaking of fluid.   The following portions of the patient's history were reviewed and updated as appropriate: allergies, current medications, past family history, past medical history, past social history, past surgical history and problem list. Problem list updated.  Objective:   Vitals:   01/21/18 0938  BP: (!) 135/93  Pulse: 92  Weight: 236 lb (107 kg)    Fetal Status: Fetal Heart Rate (bpm): NST   Movement: Present     General:  Alert, oriented and cooperative. Patient is in no acute distress.  Skin: Skin is warm and dry. No rash noted.   Cardiovascular: Normal heart rate noted  Respiratory: Normal respiratory effort, no problems with respiration noted  Abdomen: Soft, gravid, appropriate for gestational age.  Pain/Pressure: Present     Pelvic: Cervical exam performed        Extremities: Normal range of motion.     Mental Status:  Normal mood and affect. Normal behavior. Normal judgment and thought content.   Assessment and Plan:  Pregnancy: G2P1001 at 3597w4d  1. Supervision of high risk pregnancy, antepartum Cont antenatal testing 2x/week  NST reviewed and reactive. Pt is having reg q 3 min  uterine contractions on the monitor.     2. Pregnancy with type 2 diabetes mellitus in third trimester Pt did not bring glc log.  Last fasting was 94. She says they run 112-120 1 hour postprandial pt does not remember. She said it 'varies' She reports checking it 'when she feels like her sugar is low'   S>D  48%ile  12/28/2017 Has repeat US in 4 days.   3. Pregnancy complicated by noncompliance, antepartum  4. Obesity in pregnancy  5. Pre-existing essential hypertension during pregnancy in third trimester BP is WNL  6. Hx of preeclampsia, prior pregnancy, currently pregnant  7. Current mild episode of major depressive disorder, unspecified whether recurrent (HCC)   Pt has been having regular uterine contractions. She is in latent phase labor. Her cervix is unchanged. Pt reports that her job is taxing and is keeping her from checking her glucose. Pt agrees that if she is taken out of work she will promise to bring in her glc log and check her glc 4 times per day as ordered.    Preterm labor symptoms and general obstetric precautions including but not limited to vaginal bleeding, contractions, leaking of fluid and fetal movement were reviewed in detail with the patient. Please refer to After Visit Summary for other counseling recommendations.  Return in about 1 week (around 01/28/2018).   Willodean Rosenthalarolyn Harraway-Smith, MD

## 2018-01-23 ENCOUNTER — Inpatient Hospital Stay (HOSPITAL_COMMUNITY)
Admission: AD | Admit: 2018-01-23 | Discharge: 2018-01-23 | Disposition: A | Discharge status: Home / Self Care | Payer: BLUE CROSS/BLUE SHIELD | Source: Ambulatory Visit | Attending: Obstetrics & Gynecology | Admitting: Obstetrics & Gynecology

## 2018-01-23 ENCOUNTER — Encounter (HOSPITAL_COMMUNITY): Payer: Self-pay

## 2018-01-23 DIAGNOSIS — Z3490 Encounter for supervision of normal pregnancy, unspecified, unspecified trimester: Secondary | ICD-10-CM | POA: Diagnosis not present

## 2018-01-23 DIAGNOSIS — Z3A00 Weeks of gestation of pregnancy not specified: Secondary | ICD-10-CM | POA: Diagnosis not present

## 2018-01-23 DIAGNOSIS — O479 False labor, unspecified: Secondary | ICD-10-CM

## 2018-01-23 NOTE — MAU Note (Signed)
Patient presents with irregular contractions since Thursday, having bloody mucus discharge.

## 2018-01-23 NOTE — Discharge Instructions (Signed)
Braxton Hicks Contractions °Contractions of the uterus can occur throughout pregnancy, but they are not always a sign that you are in labor. You may have practice contractions called Braxton Hicks contractions. These false labor contractions are sometimes confused with true labor. °What are Braxton Hicks contractions? °Braxton Hicks contractions are tightening movements that occur in the muscles of the uterus before labor. Unlike true labor contractions, these contractions do not result in opening (dilation) and thinning of the cervix. Toward the end of pregnancy (32-34 weeks), Braxton Hicks contractions can happen more often and may become stronger. These contractions are sometimes difficult to tell apart from true labor because they can be very uncomfortable. You should not feel embarrassed if you go to the hospital with false labor. °Sometimes, the only way to tell if you are in true labor is for your health care provider to look for changes in the cervix. The health care provider will do a physical exam and may monitor your contractions. If you are not in true labor, the exam should show that your cervix is not dilating and your water has not broken. °If there are other health problems associated with your pregnancy, it is completely safe for you to be sent home with false labor. You may continue to have Braxton Hicks contractions until you go into true labor. °How to tell the difference between true labor and false labor °True labor °· Contractions last 30-70 seconds. °· Contractions become very regular. °· Discomfort is usually felt in the top of the uterus, and it spreads to the lower abdomen and low back. °· Contractions do not go away with walking. °· Contractions usually become more intense and increase in frequency. °· The cervix dilates and gets thinner. °False labor °· Contractions are usually shorter and not as strong as true labor contractions. °· Contractions are usually irregular. °· Contractions  are often felt in the front of the lower abdomen and in the groin. °· Contractions may go away when you walk around or change positions while lying down. °· Contractions get weaker and are shorter-lasting as time goes on. °· The cervix usually does not dilate or become thin. °Follow these instructions at home: °· Take over-the-counter and prescription medicines only as told by your health care provider. °· Keep up with your usual exercises and follow other instructions from your health care provider. °· Eat and drink lightly if you think you are going into labor. °· If Braxton Hicks contractions are making you uncomfortable: °? Change your position from lying down or resting to walking, or change from walking to resting. °? Sit and rest in a tub of warm water. °? Drink enough fluid to keep your urine pale yellow. Dehydration may cause these contractions. °? Do slow and deep breathing several times an hour. °· Keep all follow-up prenatal visits as told by your health care provider. This is important. °Contact a health care provider if: °· You have a fever. °· You have continuous pain in your abdomen. °Get help right away if: °· Your contractions become stronger, more regular, and closer together. °· You have fluid leaking or gushing from your vagina. °· You pass blood-tinged mucus (bloody show). °· You have bleeding from your vagina. °· You have low back pain that you never had before. °· You feel your baby’s head pushing down and causing pelvic pressure. °· Your baby is not moving inside you as much as it used to. °Summary °· Contractions that occur before labor are called Braxton   Hicks contractions, false labor, or practice contractions. °· Braxton Hicks contractions are usually shorter, weaker, farther apart, and less regular than true labor contractions. True labor contractions usually become progressively stronger and regular and they become more frequent. °· Manage discomfort from Braxton Hicks contractions by  changing position, resting in a warm bath, drinking plenty of water, or practicing deep breathing. °This information is not intended to replace advice given to you by your health care provider. Make sure you discuss any questions you have with your health care provider. °Document Released: 03/11/2017 Document Revised: 03/11/2017 Document Reviewed: 03/11/2017 °Elsevier Interactive Patient Education © 2018 [employer] ° °

## 2018-01-23 NOTE — Progress Notes (Signed)
Dr Rachelle HoraMoss notified of presenting compliant, review of fetal heart tracing and contraction pattern.  Orders to d/c patient home

## 2018-01-25 ENCOUNTER — Other Ambulatory Visit (HOSPITAL_COMMUNITY): Payer: Self-pay | Admitting: Obstetrics and Gynecology

## 2018-01-25 ENCOUNTER — Ambulatory Visit (INDEPENDENT_AMBULATORY_CARE_PROVIDER_SITE_OTHER): Payer: BLUE CROSS/BLUE SHIELD

## 2018-01-25 ENCOUNTER — Ambulatory Visit (HOSPITAL_COMMUNITY)
Admission: RE | Admit: 2018-01-25 | Discharge: 2018-01-25 | Disposition: A | Discharge status: Home / Self Care | Payer: BLUE CROSS/BLUE SHIELD | Source: Ambulatory Visit | Attending: Family Medicine | Admitting: Family Medicine

## 2018-01-25 ENCOUNTER — Encounter (HOSPITAL_COMMUNITY): Payer: Self-pay

## 2018-01-25 VITALS — BP 134/86 | HR 101

## 2018-01-25 DIAGNOSIS — O10013 Pre-existing essential hypertension complicating pregnancy, third trimester: Secondary | ICD-10-CM

## 2018-01-25 DIAGNOSIS — O99213 Obesity complicating pregnancy, third trimester: Secondary | ICD-10-CM

## 2018-01-25 DIAGNOSIS — Z362 Encounter for other antenatal screening follow-up: Secondary | ICD-10-CM | POA: Diagnosis present

## 2018-01-25 DIAGNOSIS — O0990 Supervision of high risk pregnancy, unspecified, unspecified trimester: Secondary | ICD-10-CM

## 2018-01-25 DIAGNOSIS — O09293 Supervision of pregnancy with other poor reproductive or obstetric history, third trimester: Secondary | ICD-10-CM | POA: Insufficient documentation

## 2018-01-25 DIAGNOSIS — O99210 Obesity complicating pregnancy, unspecified trimester: Secondary | ICD-10-CM

## 2018-01-25 DIAGNOSIS — Z9119 Patient's noncompliance with other medical treatment and regimen: Secondary | ICD-10-CM

## 2018-01-25 DIAGNOSIS — O24119 Pre-existing diabetes mellitus, type 2, in pregnancy, unspecified trimester: Secondary | ICD-10-CM

## 2018-01-25 DIAGNOSIS — O24113 Pre-existing diabetes mellitus, type 2, in pregnancy, third trimester: Secondary | ICD-10-CM

## 2018-01-25 DIAGNOSIS — E669 Obesity, unspecified: Secondary | ICD-10-CM | POA: Diagnosis not present

## 2018-01-25 DIAGNOSIS — Z3A37 37 weeks gestation of pregnancy: Secondary | ICD-10-CM

## 2018-01-25 DIAGNOSIS — O403XX0 Polyhydramnios, third trimester, not applicable or unspecified: Secondary | ICD-10-CM | POA: Insufficient documentation

## 2018-01-25 DIAGNOSIS — O09899 Supervision of other high risk pregnancies, unspecified trimester: Secondary | ICD-10-CM

## 2018-01-25 NOTE — Progress Notes (Signed)
NST only visit.patient states she lost her mucous plug on Sunday. Armandina StammerJennifer Howard RNBSN

## 2018-01-26 NOTE — Progress Notes (Signed)
Chart reviewed for nurse visit. Agree with plan of care. NST reactive.  Neill, Caroline M, CNM 01/26/2018 9:09 AM   

## 2018-01-28 ENCOUNTER — Inpatient Hospital Stay (HOSPITAL_COMMUNITY): Payer: BLUE CROSS/BLUE SHIELD | Admitting: Anesthesiology

## 2018-01-28 ENCOUNTER — Ambulatory Visit (INDEPENDENT_AMBULATORY_CARE_PROVIDER_SITE_OTHER): Payer: BLUE CROSS/BLUE SHIELD | Admitting: Family Medicine

## 2018-01-28 ENCOUNTER — Inpatient Hospital Stay (HOSPITAL_COMMUNITY)
Admission: AD | Admit: 2018-01-28 | Discharge: 2018-01-31 | DRG: 805 | Disposition: A | Discharge status: Home / Self Care | Payer: BLUE CROSS/BLUE SHIELD | Source: Ambulatory Visit | Attending: Obstetrics and Gynecology | Admitting: Obstetrics and Gynecology

## 2018-01-28 VITALS — BP 121/86 | HR 100

## 2018-01-28 DIAGNOSIS — Z794 Long term (current) use of insulin: Secondary | ICD-10-CM

## 2018-01-28 DIAGNOSIS — O1495 Unspecified pre-eclampsia, complicating the puerperium: Secondary | ICD-10-CM | POA: Diagnosis not present

## 2018-01-28 DIAGNOSIS — O24119 Pre-existing diabetes mellitus, type 2, in pregnancy, unspecified trimester: Secondary | ICD-10-CM | POA: Diagnosis present

## 2018-01-28 DIAGNOSIS — O10013 Pre-existing essential hypertension complicating pregnancy, third trimester: Secondary | ICD-10-CM | POA: Diagnosis not present

## 2018-01-28 DIAGNOSIS — O99214 Obesity complicating childbirth: Secondary | ICD-10-CM | POA: Diagnosis present

## 2018-01-28 DIAGNOSIS — O0993 Supervision of high risk pregnancy, unspecified, third trimester: Secondary | ICD-10-CM

## 2018-01-28 DIAGNOSIS — R87610 Atypical squamous cells of undetermined significance on cytologic smear of cervix (ASC-US): Secondary | ICD-10-CM | POA: Diagnosis present

## 2018-01-28 DIAGNOSIS — Z3A37 37 weeks gestation of pregnancy: Secondary | ICD-10-CM | POA: Diagnosis not present

## 2018-01-28 DIAGNOSIS — E119 Type 2 diabetes mellitus without complications: Secondary | ICD-10-CM | POA: Diagnosis not present

## 2018-01-28 DIAGNOSIS — O368390 Maternal care for abnormalities of the fetal heart rate or rhythm, unspecified trimester, not applicable or unspecified: Secondary | ICD-10-CM | POA: Diagnosis present

## 2018-01-28 DIAGNOSIS — O2412 Pre-existing diabetes mellitus, type 2, in childbirth: Secondary | ICD-10-CM | POA: Diagnosis present

## 2018-01-28 DIAGNOSIS — E1165 Type 2 diabetes mellitus with hyperglycemia: Secondary | ICD-10-CM | POA: Diagnosis present

## 2018-01-28 DIAGNOSIS — O0990 Supervision of high risk pregnancy, unspecified, unspecified trimester: Secondary | ICD-10-CM

## 2018-01-28 DIAGNOSIS — O169 Unspecified maternal hypertension, unspecified trimester: Secondary | ICD-10-CM | POA: Diagnosis present

## 2018-01-28 DIAGNOSIS — O09299 Supervision of pregnancy with other poor reproductive or obstetric history, unspecified trimester: Secondary | ICD-10-CM

## 2018-01-28 DIAGNOSIS — O24414 Gestational diabetes mellitus in pregnancy, insulin controlled: Secondary | ICD-10-CM | POA: Diagnosis not present

## 2018-01-28 DIAGNOSIS — R87810 Cervical high risk human papillomavirus (HPV) DNA test positive: Secondary | ICD-10-CM | POA: Diagnosis present

## 2018-01-28 LAB — CBC
HCT: 32.6 % — ABNORMAL LOW (ref 36.0–46.0)
HEMOGLOBIN: 10.6 g/dL — AB (ref 12.0–15.0)
MCH: 22.3 pg — ABNORMAL LOW (ref 26.0–34.0)
MCHC: 32.5 g/dL (ref 30.0–36.0)
MCV: 68.6 fL — ABNORMAL LOW (ref 78.0–100.0)
Platelets: 218 10*3/uL (ref 150–400)
RBC: 4.75 MIL/uL (ref 3.87–5.11)
RDW: 15.6 % — ABNORMAL HIGH (ref 11.5–15.5)
WBC: 6.6 10*3/uL (ref 4.0–10.5)

## 2018-01-28 LAB — GLUCOSE, CAPILLARY
GLUCOSE-CAPILLARY: 110 mg/dL — AB (ref 65–99)
GLUCOSE-CAPILLARY: 87 mg/dL (ref 65–99)
GLUCOSE-CAPILLARY: 134 mg/dL — AB (ref 65–99)
GLUCOSE-CAPILLARY: 114 mg/dL — AB (ref 65–99)
GLUCOSE-CAPILLARY: 97 mg/dL (ref 65–99)
GLUCOSE-CAPILLARY: 104 mg/dL — AB (ref 65–99)
GLUCOSE-CAPILLARY: 91 mg/dL (ref 65–99)
Glucose-Capillary: 104 mg/dL — ABNORMAL HIGH (ref 65–99)
Glucose-Capillary: 110 mg/dL — ABNORMAL HIGH (ref 65–99)

## 2018-01-28 LAB — HEMOGLOBIN A1C
HEMOGLOBIN A1C: 8.6 % — AB (ref 4.8–5.6)
Mean Plasma Glucose: 200.12 mg/dL

## 2018-01-28 LAB — TYPE AND SCREEN
ABO/RH(D): A POS
ANTIBODY SCREEN: NEGATIVE

## 2018-01-28 MED ORDER — LIDOCAINE HCL (PF) 1 % IJ SOLN
INTRAMUSCULAR | Status: DC | PRN
  Administered 2018-01-28 (×2): 4 mL

## 2018-01-28 MED ORDER — SODIUM CHLORIDE 0.9 % IV SOLN
INTRAVENOUS | Status: DC
  Filled 2018-01-28: qty 1

## 2018-01-28 MED ORDER — TERBUTALINE SULFATE 1 MG/ML IJ SOLN
0.2500 mg | Freq: Once | INTRAMUSCULAR | Status: AC | PRN
  Administered 2018-01-28: 0.25 mg via SUBCUTANEOUS
  Filled 2018-01-28: qty 1

## 2018-01-28 MED ORDER — OXYTOCIN BOLUS FROM INFUSION
500.0000 mL | Freq: Once | INTRAVENOUS | Status: AC
  Administered 2018-01-29: 500 mL via INTRAVENOUS

## 2018-01-28 MED ORDER — FENTANYL 2.5 MCG/ML BUPIVACAINE 1/10 % EPIDURAL INFUSION (WH - ANES)
14.0000 mL/h | INTRAMUSCULAR | Status: DC | PRN
  Administered 2018-01-28 – 2018-01-29 (×2): 14 mL/h via EPIDURAL
  Filled 2018-01-28 (×3): qty 100

## 2018-01-28 MED ORDER — LACTATED RINGERS IV SOLN
INTRAVENOUS | Status: DC
  Administered 2018-01-28 – 2018-01-29 (×4): via INTRAVENOUS

## 2018-01-28 MED ORDER — PHENYLEPHRINE 40 MCG/ML (10ML) SYRINGE FOR IV PUSH (FOR BLOOD PRESSURE SUPPORT)
80.0000 ug | PREFILLED_SYRINGE | INTRAVENOUS | Status: DC | PRN
  Filled 2018-01-28: qty 5
  Filled 2018-01-28: qty 10

## 2018-01-28 MED ORDER — OXYTOCIN 40 UNITS IN LACTATED RINGERS INFUSION - SIMPLE MED
1.0000 m[IU]/min | INTRAVENOUS | Status: DC
  Administered 2018-01-28 – 2018-01-29 (×2): 2 m[IU]/min via INTRAVENOUS

## 2018-01-28 MED ORDER — OXYCODONE-ACETAMINOPHEN 5-325 MG PO TABS: 2.0000 | ORAL_TABLET | ORAL | Status: DC | PRN

## 2018-01-28 MED ORDER — LACTATED RINGERS IV SOLN
500.0000 mL | INTRAVENOUS | Status: DC | PRN
Start: 2018-01-28 — End: 2018-01-29
  Administered 2018-01-28 – 2018-01-29 (×4): 500 mL via INTRAVENOUS

## 2018-01-28 MED ORDER — LACTATED RINGERS IV SOLN
500.0000 mL | Freq: Once | INTRAVENOUS | Status: AC
  Administered 2018-01-28: 500 mL via INTRAVENOUS

## 2018-01-28 MED ORDER — DIPHENHYDRAMINE HCL 50 MG/ML IJ SOLN: 12.5000 mg | INTRAMUSCULAR | Status: DC | PRN

## 2018-01-28 MED ORDER — ONDANSETRON HCL 4 MG/2ML IJ SOLN: 4.0000 mg | Freq: Four times a day (QID) | INTRAMUSCULAR | Status: DC | PRN

## 2018-01-28 MED ORDER — EPHEDRINE 5 MG/ML INJ
10.0000 mg | INTRAVENOUS | Status: DC | PRN
  Filled 2018-01-28: qty 2

## 2018-01-28 MED ORDER — OXYTOCIN 40 UNITS IN LACTATED RINGERS INFUSION - SIMPLE MED
2.5000 [IU]/h | INTRAVENOUS | Status: DC
  Administered 2018-01-29: 2.5 [IU]/h via INTRAVENOUS
  Filled 2018-01-28: qty 1000

## 2018-01-28 MED ORDER — INSULIN REGULAR BOLUS VIA INFUSION
0.0000 [IU] | Freq: Three times a day (TID) | INTRAVENOUS | Status: DC
  Filled 2018-01-28: qty 10

## 2018-01-28 MED ORDER — FLEET ENEMA 7-19 GM/118ML RE ENEM: 1.0000 | ENEMA | RECTAL | Status: DC | PRN

## 2018-01-28 MED ORDER — DEXTROSE 50 % IV SOLN: 25.0000 mL | INTRAVENOUS | Status: DC | PRN

## 2018-01-28 MED ORDER — SODIUM CHLORIDE 0.9 % IV SOLN: INTRAVENOUS | Status: DC

## 2018-01-28 MED ORDER — TERBUTALINE SULFATE 1 MG/ML IJ SOLN
INTRAMUSCULAR | Status: AC
  Administered 2018-01-28: 0.25 mg
  Filled 2018-01-28: qty 1

## 2018-01-28 MED ORDER — OXYCODONE-ACETAMINOPHEN 5-325 MG PO TABS: 1.0000 | ORAL_TABLET | ORAL | Status: DC | PRN

## 2018-01-28 MED ORDER — PHENYLEPHRINE 40 MCG/ML (10ML) SYRINGE FOR IV PUSH (FOR BLOOD PRESSURE SUPPORT)
80.0000 ug | PREFILLED_SYRINGE | INTRAVENOUS | Status: DC | PRN
  Filled 2018-01-28: qty 10
  Filled 2018-01-28: qty 5

## 2018-01-28 MED ORDER — LIDOCAINE HCL (PF) 1 % IJ SOLN
30.0000 mL | INTRAMUSCULAR | Status: DC | PRN
  Filled 2018-01-28: qty 30

## 2018-01-28 MED ORDER — DEXTROSE-NACL 5-0.45 % IV SOLN
INTRAVENOUS | Status: DC
  Administered 2018-01-28: 16:00:00 via INTRAVENOUS

## 2018-01-28 MED ORDER — SOD CITRATE-CITRIC ACID 500-334 MG/5ML PO SOLN
30.0000 mL | ORAL | Status: DC | PRN
  Filled 2018-01-28: qty 15

## 2018-01-28 MED ORDER — ACETAMINOPHEN 325 MG PO TABS: 650.0000 mg | ORAL_TABLET | ORAL | Status: DC | PRN

## 2018-01-28 NOTE — Anesthesia Procedure Notes (Signed)
Epidural Patient location during procedure: OB Start time: 01/28/2018 10:18 PM End time: 01/28/2018 10:28 PM  Staffing Anesthesiologist: Lewie LoronGermeroth, Arica B[place], MD Performed: anesthesiologist   Preanesthetic Checklist Completed: patient identified, pre-op evaluation, timeout performed, IV checked, risks and benefits discussed and monitors and equipment checked  Epidural Patient position: sitting Prep: site prepped and draped and DuraPrep Patient monitoring: heart rate, continuous pulse ox and blood pressure Approach: midline Location: L3-L4 Injection technique: LOR air and LOR saline  Needle:  Needle type: Tuohy  Needle gauge: 17 G Needle length: 9 cm Needle insertion depth: 7 cm Catheter type: closed end flexible Catheter size: 19 Gauge Catheter at skin depth: 12 cm Test dose: negative  Assessment Sensory level: T8 Events: blood not aspirated, injection not painful, no injection resistance, negative IV test and no paresthesia  Additional Notes Reason for block:procedure for pain

## 2018-01-28 NOTE — Progress Notes (Signed)
L&D Note  01/28/2018 - 11:42 PM---Late entry for 2130  26 y.o. G2P1001 4642w4d. Pregnancy complicated by:  Patient Active Problem List   Diagnosis Date Noted  . Variable fetal heart rate decelerations, antepartum 01/28/2018  . Pregnancy complicated by noncompliance, antepartum 12/28/2017  . Hypertension during pregnancy 08/10/2017  . Atypical squamous cell changes of undetermined significance (ASCUS) on cervical cytology with positive high risk human papilloma virus (HPV) 08/10/2017  . Hx of preeclampsia, prior pregnancy, currently pregnant 08/09/2017  . Supervision of high risk pregnancy, antepartum 08/04/2017  . Obesity in pregnancy 08/04/2017  . Type 2 diabetes mellitus (HCC) 08/04/2017  . Type 2 diabetes mellitus in pregnancy 06/29/2017  . Depression 12/18/2013    [name] is admitted for IOL for NR fetal HR tracing   Subjective:  Resting comfortably, feels UCs.   Objective:   Vitals:   01/28/18 2250 01/28/18 2255 01/28/18 2300 01/28/18 2330  BP: 116/68 114/65 122/73 113/67  Pulse: (!) 104 (!) 103 89 92  Resp: 18 18  17   Temp:   98.1 F (36.7 C)   TempSrc:   Axillary   SpO2:      Weight:      Height:        Current Vital Signs 24h Vital Sign Ranges  T 98.1 F (36.7 C) Temp  Avg: 98.2 F (36.8 C)  Min: 98.1 F (36.7 C)  Max: 98.6 F (37 C)  BP 113/67 BP  Min: 113/67  Max: 148/90  HR 92 Pulse  Avg: 94.7  Min: 72  Max: 121  RR 17 Resp  Avg: 18.2  Min: 17  Max: 20  SaO2 100 %   SpO2  Avg: 99.4 %  Min: 99 %  Max: 100 %       24 Hour I/O Current Shift I/O  Time Ins Outs No intake/output data recorded. No intake/output data recorded.   FHR: 160 baseline, no accels, slight late decel to the 130s about 1973m prior, mod variability Toco: q5414m Gen: NAD, resting SVE: unchanged at 4  Labs:  Recent Labs  Lab 01/28/18 1353  WBC 6.6  HGB 10.6*  HCT 32.6*  PLT 218   A POS  Results for Jennifer Montes, Jennifer (MRN [id number]) as of 01/28/2018 23:44  Ref. Range  01/28/2018 18:30 01/28/2018 19:25 01/28/2018 20:38 01/28/2018 21:47 01/28/2018 22:53  Glucose-Capillary Latest Ref Range: 65 - 99 mg/dL 409110 (H) 811114 (H) 97 914104 (H) 91   Medications Current Facility-Administered Medications  Medication Dose Route Frequency Provider Last Rate Last Dose  . 0.9 %  sodium chloride infusion   Intravenous Continuous Rolm BookbinderNeill, Caroline M, CNM      . acetaminophen (TYLENOL) tablet 650 mg  650 mg Oral Q4H PRN Rolm BookbinderNeill, Caroline M, CNM      . dextrose 5 %-0.45 % sodium chloride infusion   Intravenous Continuous Rolm Bookbindereill, Caroline M, CNM 125 mL/hr at 01/28/18 1540    . dextrose 50 % solution 25 mL  25 mL Intravenous PRN Rolm BookbinderNeill, Caroline M, CNM      . diphenhydrAMINE (BENADRYL) injection 12.5 mg  12.5 mg Intravenous Q15 min PRN Trevor IhaHouser, Stephen A, MD      . ePHEDrine injection 10 mg  10 mg Intravenous PRN Trevor IhaHouser, Stephen A, MD      . ePHEDrine injection 10 mg  10 mg Intravenous PRN Trevor IhaHouser, Stephen A, MD      . fentaNYL 2.5 mcg/ml w/bupivacaine 0.1% in NS 100ml epidural infusion (WH-ANES)  14 mL/hr Epidural Continuous PRN  Trevor Iha, MD 14 mL/hr at 01/28/18 2228 14 mL/hr at 01/28/18 2228  . insulin regular (NOVOLIN R,HUMULIN R) 100 Units in sodium chloride 0.9 % 100 mL (1 Units/mL) infusion   Intravenous Continuous Rolm Bookbinder, CNM      . insulin regular bolus via infusion 0-10 Units  0-10 Units Intravenous TID WC Rolm Bookbinder, CNM   Stopped at 01/28/18 1625  . lactated ringers infusion 500-1,000 mL  500-1,000 mL Intravenous PRN Rolm Bookbinder, CNM 1,000 mL/hr at 01/28/18 2006 500 mL at 01/28/18 2006  . lactated ringers infusion   Intravenous Continuous Rolm Bookbinder, [place] 125 mL/hr at 01/28/18 2041    . lidocaine (PF) (XYLOCAINE) 1 % injection 30 mL  30 mL Subcutaneous PRN Rolm Bookbinder, CNM      . ondansetron (ZOFRAN) injection 4 mg  4 mg Intravenous Q6H PRN Rolm Bookbinder, CNM      . oxyCODONE-acetaminophen (PERCOCET/ROXICET) 5-325 MG per tablet 1 tablet  1  tablet Oral Q4H PRN Rolm Bookbinder, CNM      . oxyCODONE-acetaminophen (PERCOCET/ROXICET) 5-325 MG per tablet 2 tablet  2 tablet Oral Q4H PRN Rolm Bookbinder, CNM      . oxytocin (PITOCIN) IV BOLUS FROM BAG  500 mL Intravenous Once Rolm Bookbinder, CNM      . oxytocin (PITOCIN) IV infusion 40 units in LR 1000 mL - Premix  2.5 Units/hr Intravenous Continuous Rolm Bookbinder, CNM      . oxytocin (PITOCIN) IV infusion 40 units in LR 1000 mL - Premix  1-40 milli-units/min Intravenous Titrated Rolm Bookbinder, CNM   Stopped at 01/28/18 1810  . PHENYLephrine 40 mcg/ml in normal saline Adult IV Push Syringe  80 mcg Intravenous PRN Trevor Iha, MD      . PHENYLephrine 40 mcg/ml in normal saline Adult IV Push Syringe  80 mcg Intravenous PRN Trevor Iha, MD      . sodium citrate-citric acid (ORACIT) solution 30 mL  30 mL Oral Q2H PRN Rolm Bookbinder, CNM      . sodium phosphate (FLEET) 7-19 GM/118ML enema 1 enema  1 enema Rectal PRN Rolm Bookbinder, CNM       Facility-Administered Medications Ordered in Other Encounters  Medication Dose Route Frequency Provider Last Rate Last Dose  . lidocaine (PF) (XYLOCAINE) 1 % injection    Anesthesia Intra-op Lewie Loron, MD   4 mL at 01/28/18 2227    Assessment & Plan:  Pt stable *IUP: category II tracing but normal baseline, +accels, moderate variablity *IOL: fetus looks good. Will start pitocin and see how fetus tolerate it.  *DM2: on glucose stabilizer. Continue with qhour checks and start gtt if 140 or greater *GBS: neg *?cHTN: no s/s of pre-eclampsia. Continue to follow closely *Analgesia: can start pit after epidural placement which she desires, which is fine.   Cornelia Copa MD Attending Center for [facility])

## 2018-01-28 NOTE — Progress Notes (Signed)
L&D Note  Having worse late decels.   Pt getting IVF bolus in anticipation for epidural. Pt feeling more uncomfortable AF VS normal and stable SVE: unchanged at 4/50/-1 to -2.  Stop pit, continue ivf and start o2 and do LLR position change and a dose of terb. D/w pt to hold off on epidural for now to let fetus recovery and decrease risk of hypotension. Will reassess in one hour. If not making cx change and still having recurrent lates d/w pt may need to proceed with c-section  Jennifer Montes, Jr MD Attending Center for [facility] (Faculty Practice) 01/28/2018 Time: [phone]

## 2018-01-28 NOTE — Progress Notes (Signed)
Subjective:  Jennifer Montes is a 27 y.o. G2P1001 at 4663w4d being seen today for ongoing prenatal care.  She is currently monitored for the following issues for this high-risk pregnancy and has Type 2 diabetes mellitus in pregnancy; Supervision of high risk pregnancy, antepartum; Obesity in pregnancy; Type 2 diabetes mellitus (HCC); Depression; Hx of preeclampsia, prior pregnancy, currently pregnant; Hypertension during pregnancy; Atypical squamous cell changes of undetermined significance (ASCUS) on cervical cytology with positive high risk human papilloma virus (HPV); and Pregnancy complicated by noncompliance, antepartum on their problem list.  GDM: Patient taking Levemir and humalog.  Reports no hypoglycemic episodes.  Tolerating medication well. Patient did not bring log book Fasting:  Reports controlled: <90 2hr PP: reports controlled: <120  Patient reports no complaints.  Contractions: Irregular. Vag. Bleeding: None.  Movement: Present. Denies leaking of fluid.   The following portions of the patient's history were reviewed and updated as appropriate: allergies, current medications, past family history, past medical history, past social history, past surgical history and problem list. Problem list updated.  Objective:   Vitals:   01/28/18 0933  BP: 121/86  Pulse: 100    Fetal Status:     Movement: Present     General:  Alert, oriented and cooperative. Patient is in no acute distress.  Skin: Skin is warm and dry. No rash noted.   Cardiovascular: Normal heart rate noted  Respiratory: Normal respiratory effort, no problems with respiration noted  Abdomen: Soft, gravid, appropriate for gestational age. Pain/Pressure: Present     Pelvic: Vag. Bleeding: None     Cervical exam deferred        Extremities: Normal range of motion.     Mental Status: Normal mood and affect. Normal behavior. Normal judgment and thought content.   Urinalysis:      Assessment and Plan:  Pregnancy: G2P1001  at 6363w4d  1. Supervision of high risk pregnancy, antepartum NST nonreassuring with a couple variable decelerations and a couple that look like late decelerations with contractions. Discussed pt with Dr Penne LashLeggett - agree with direct admission for induction  2. Type 2 diabetes mellitus without complication, with long-term current use of insulin (HCC)   3. Pre-existing essential hypertension during pregnancy in third trimester  Term labor symptoms and general obstetric precautions including but not limited to vaginal bleeding, contractions, leaking of fluid and fetal movement were reviewed in detail with the patient. Please refer to After Visit Summary for other counseling recommendations.  No follow-ups on file.   Levie HeritageStinson, Shain Pauwels J, DO

## 2018-01-28 NOTE — Progress Notes (Signed)
L&D Note  01/28/2018 - 5:56 PM  27 y.o. G2P1001 [redacted]w[redacted]d. Pregnancy complicated by:  Patient Active Problem List   Diagnosis Date Noted  . Variable fetal heart rate decelerations, antepartum 01/28/2018  . Pregnancy complicated by noncompliance, antepartum 12/28/2017  . Hypertension during pregnancy 08/10/2017  . Atypical squamous cell changes of undetermined significance (ASCUS) on cervical cytology with positive high risk human papilloma virus (HPV) 08/10/2017  . Hx of preeclampsia, prior pregnancy, currently pregnant 08/09/2017  . Supervision of high risk pregnancy, antepartum 08/04/2017  . Obesity in pregnancy 08/04/2017  . Type 2 diabetes mellitus (HCC) 08/04/2017  . Type 2 diabetes mellitus in pregnancy 06/29/2017  . Depression 12/18/2013    Ms. Jennifer Jennifer Montes Jennifer Montes is admitted for IOL for NR fetal HR tracing   Subjective:  Resting comfortable. Feels UCs but not uncomfortable  Objective:   Vitals:   01/28/18 1145 01/28/18 1300 01/28/18 1420  BP: 139/87 134/86 133/83  Pulse: 88 77 74  Resp: 18    Temp: 98.6 F (37 C)    TempSrc: Oral    Weight: 248 lb 12.8 oz (112.9 kg)    Height: 5\' 4"  (1.626 m)      Current Vital Signs 24h Vital Sign Ranges  T 98.6 F (37 C) Temp  Avg: 98.6 F (37 C)  Min: 98.6 F (37 C)  Max: 98.6 F (37 C)  BP 133/83 BP  Min: 121/86  Max: 139/87  HR 74 Pulse  Avg: 84.8  Min: 74  Max: 100  RR 18 Resp  Avg: 18  Min: 18  Max: 18  SaO2     No data recorded       24 Hour I/O Current Shift I/O  Time Ins Outs No intake/output data recorded. No intake/output data recorded.   FHR: 165 baseline, no accels, recurrent late decels to the 130s, mod variability Toco: q2-102m Gen: NAD, resting SVE: 4/50/-1/BBOW-->arom clear fluid, iupc placed but unable to place FSE  Labs:  Recent Labs  Lab 01/28/18 1353  WBC 6.6  HGB 10.6*  HCT 32.6*  PLT 218   A POS  Results for Jennifer Jennifer Montes, Jennifer Montes (MRN [id number]) as of 01/28/2018 17:58  Ref. Range 01/28/2018 13:04  01/28/2018 13:53 01/28/2018 15:16 01/28/2018 16:25 01/28/2018 17:27  Glucose-Capillary Latest Ref Range: 65 - 99 mg/dL 409 (H)  87 811 (H) 914 (H)   Medications Current Facility-Administered Medications  Medication Dose Route Frequency Provider Last Rate Last Dose  . 0.9 %  sodium chloride infusion   Intravenous Continuous Rolm Bookbinder, CNM      . acetaminophen (TYLENOL) tablet 650 mg  650 mg Oral Q4H PRN Rolm Bookbinder, CNM      . dextrose 5 %-0.45 % sodium chloride infusion   Intravenous Continuous Rolm Bookbinder, CNM 125 mL/hr at 01/28/18 1540    . dextrose 50 % solution 25 mL  25 mL Intravenous PRN Rolm Bookbinder, CNM      . insulin regular (NOVOLIN R,HUMULIN R) 100 Units in sodium chloride 0.9 % 100 mL (1 Units/mL) infusion   Intravenous Continuous Rolm Bookbinder, CNM      . insulin regular bolus via infusion 0-10 Units  0-10 Units Intravenous TID WC Rolm Bookbinder, CNM   Stopped at 01/28/18 1625  . lactated ringers infusion 500-1,000 mL  500-1,000 mL Intravenous PRN Rolm Bookbinder, CNM      . lactated ringers infusion   Intravenous Continuous Rolm Bookbinder, [place] 125 mL/hr at 01/28/18 1201    .  lidocaine (PF) (XYLOCAINE) 1 % injection 30 mL  30 mL Subcutaneous PRN Rolm BookbinderNeill, Caroline M, CNM      . ondansetron (ZOFRAN) injection 4 mg  4 mg Intravenous Q6H PRN Rolm BookbinderNeill, Caroline M, CNM      . oxyCODONE-acetaminophen (PERCOCET/ROXICET) 5-325 MG per tablet 1 tablet  1 tablet Oral Q4H PRN Rolm BookbinderNeill, Caroline M, CNM      . oxyCODONE-acetaminophen (PERCOCET/ROXICET) 5-325 MG per tablet 2 tablet  2 tablet Oral Q4H PRN Rolm BookbinderNeill, Caroline M, CNM      . oxytocin (PITOCIN) IV BOLUS FROM BAG  500 mL Intravenous Once Rolm BookbinderNeill, Caroline M, CNM      . oxytocin (PITOCIN) IV infusion 40 units in LR 1000 mL - Premix  2.5 Units/hr Intravenous Continuous Rolm BookbinderNeill, Caroline M, CNM      . oxytocin (PITOCIN) IV infusion 40 units in LR 1000 mL - Premix  1-40 milli-units/min Intravenous Titrated Rolm Bookbindereill, Caroline M,  CNM 9 mL/hr at 01/28/18 1515 6 milli-units/min at 01/28/18 1515  . sodium citrate-citric acid (ORACIT) solution 30 mL  30 mL Oral Q2H PRN Rolm BookbinderNeill, Caroline M, CNM      . sodium phosphate (FLEET) 7-19 GM/118ML enema 1 enema  1 enema Rectal PRN Rolm BookbinderNeill, Caroline M, CNM      . terbutaline (BRETHINE) injection 0.25 mg  0.25 mg Subcutaneous Once PRN Rolm BookbinderNeill, Caroline M, CNM        Assessment & Plan:  Pt stable *IUP: category II tracing but moderate variability, see below *IOL: pt making change. AROM and IUPC placed to hopefully expedite labor and better tell if lates vs early decels. Can try and place fse later but head too anterior to place. Will continue to watch closely and leave on low dose pit for now. Pt is multip and fetus normal size at last growth u/s. Pelvis feels adequate. Recheck in 2274m if still category II. Pt made npo.  *DM2: on glucose stabilizer. Continue with qhour checks and start gtt if 140 or greater *GBS: neg *?cHTN: no s/s of pre-eclampsia. Continue to follow closely *Analgesia: no current needs. Can have epidural if she desires.   Cornelia Copaharlie Quasim Doyon, Jr. MD Attending Center for [facility](Faculty Practice)

## 2018-01-28 NOTE — Anesthesia Preprocedure Evaluation (Signed)
Anesthesia Evaluation  Patient identified by MRN, date of birth, ID band Patient awake    Reviewed: Allergy & Precautions, NPO status , Patient's Chart, lab work & pertinent test results  Airway Mallampati: III  TM Distance: >3 FB Neck ROM: Full    Dental no notable dental hx.    Pulmonary neg pulmonary ROS,    Pulmonary exam normal breath sounds clear to auscultation       Cardiovascular hypertension, negative cardio ROS Normal cardiovascular exam Rhythm:Regular Rate:Normal     Neuro/Psych PSYCHIATRIC DISORDERS Depression negative neurological ROS     GI/Hepatic negative GI ROS, Neg liver ROS,   Endo/Other  diabetes, Poorly Controlled, Type obesity  Renal/GU negative Renal ROS     Musculoskeletal negative musculoskeletal ROS (+)   Abdominal (+) + obese,   Peds  Hematology negative hematology ROS (+)   Anesthesia Other Findings   Reproductive/Obstetrics (+) Pregnancy                             Anesthesia Physical Anesthesia Plan  ASA: III  Anesthesia Plan: Epidural   Post-op Pain Management:    Induction:   PONV Risk Score and Plan:   Airway Management Planned:   Additional Equipment:   Intra-op Plan:   Post-operative Plan:   Informed Consent: I have reviewed the patients History and Physical, chart, labs and discussed the procedure including the risks, benefits and alternatives for the proposed anesthesia with the patient or authorized representative who has indicated his/her understanding and acceptance.     Plan Discussed with: CRNA  Anesthesia Plan Comments:         Anesthesia Quick Evaluation

## 2018-01-28 NOTE — Anesthesia Pain Management Evaluation Note (Signed)
  CRNA Pain Management Visit Note  Patient: Jennifer KirksChynna Montes, 27 y.o., female  "Hello [facility] am [facility]. We have an anesthesia team available at all times to provide care throughout the hospital, including epidural management and anesthesia for C-section. I don't know your plan for the delivery whether it a natural birth, water birth, IV sedation, nitrous supplementation, doula or epidural, but we want to meet your pain goals."   1.Was your pain managed to your expectations on prior hospitalizations?   Yes   2.What is your expectation for pain management during this hospitalization?     Epidural  3.How can we help you reach that goal? unsure  Record the patient's initial score and the patient's pain goal.   Pain: 7  Pain Goal: 8 [facility] wants you to be able to say your pain was always managed very well.  Cephus ShellingBURGER,Loc Feinstein 01/28/2018

## 2018-01-28 NOTE — H&P (Signed)
OBSTETRIC ADMISSION HISTORY AND PHYSICAL  Jennifer Montes is a 27 y.o. female G2P1001 with IUP at 4880w4d by 8 week u/s presenting for IOL for non reassuring fetal heart tones, uncontrolled Type 2 diabetes on insulin.. She reports +FMs, No LOF, no VB, no blurry vision, headaches or peripheral edema, and RUQ pain.  She plans on breast feeding. She request nothing for birth control. [facility]  Dating: By 8 week u/s --->  Estimated Date of Delivery: 02/14/18  [facility]renatal Labs  Dating 8 wk u/s Blood type: A/Positive/-- (09/26 1002)   Genetic Screen 1 Screen: Nml   AFP: Missed appt Antibody:Negative (09/26 1002)  Anatomic US Nml female Rubella:  immune  GTT Type 2  RPR: Non Reactive (09/26 1002)   Flu vaccine 08/04/17 HBsAg: Negative (09/26 1002)   TDaP vaccine 11/15/17                                          HIV:   Non-reactive  Baby Food Breast                  Rhophylac: NA                   GBS: neg  Contraception Considering BTL. 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 Diabetes mellitus without complication (HCC)    type2  . Hx of preeclampsia, prior pregnancy, currently pregnant     Past Surgical History: Past Surgical History:  Procedure Laterality Date  . NO PAST SURGERIES      Obstetrical History: OB History    Gravida  2   Para  1   Term  1   Preterm  0   AB  0   Living  1     SAB  0   TAB  0   Ectopic  0   Multiple  0   Live Births  1           Social History: Social History   Socioeconomic History  . Marital status: Married    Spouse name: Not on file  . Number of children: Not on file   . Years of education: Not on file  . Highest education level: Not on file  Occupational History  . Not on file  Social Needs  . Financial resource strain: Not on file  . Food insecurity:    Worry: Not on file    Inability: Not on file  . Transportation needs:    Medical: Not on file    Non-medical: Not on file  Tobacco Use  . Smoking status: Never Smoker  . Smokeless tobacco: Never Used  Substance and Sexual Activity  . Alcohol use: No  . Drug use: No  . Sexual activity: Yes    Birth control/protection: None  Lifestyle  . Physical activity:    Days per week: Not on file    Minutes per session: Not on file  . Stress: Not on file  Relationships  . Social connections:    Talks on phone: Not on  file    Gets together: Not on file    Attends religious service: Not on file    Active member of club or organization: Not on file    Attends meetings of clubs or organizations: Not on file    Relationship status: Not on file  Other Topics Concern  . Not on file  Social History Narrative  . Not on file    Family History: Family History  Problem Relation Age of Onset  . Diabetes Mother     Allergies: No Known Allergies  Medications Prior to Admission  Medication Sig Dispense Refill Last Dose  . acetaminophen (TYLENOL) 325 MG tablet Take 325 mg by mouth every 6 (six) hours as needed for mild pain.   Taking  . Insulin Detemir (LEVEMIR FLEXTOUCH) 100 UNIT/ML Pen Inject 30 Units into the skin 2 (two) times daily. 15 mL 11 Taking  . insulin lispro (HUMALOG KWIKPEN) 100 UNIT/ML KiwkPen Inject 0.1 mLs (10 Units total) into the skin 3 (three) times daily. 15 mL 11 Taking  . Prenatal Vit-Fe Fumarate-FA (PRENATAL MULTIVITAMIN) TABS tablet Take 1 tablet by mouth daily at 12 noon.   Taking   Review of Systems   All systems reviewed and negative except as stated in HPI  Blood pressure 139/87, pulse 88, temperature 98.6 F (37 C), temperature source Oral, resp. rate 18, height 5\' 4"   (1.626 m), weight 248 lb 12.8 oz (112.9 kg), last menstrual period 05/04/2017. General appearance: alert, cooperative and no distress Lungs: clear to auscultation bilaterally Heart: regular rate and rhythm Abdomen: soft, non-tender; bowel sounds normal Pelvic: n/a Extremities: Homans sign is negative, no sign of DVT DTR's +2 Presentation: cephalic Fetal monitoringBaseline: 145 bpm, Variability: Good {> 6 bpm), Accelerations: Reactive and Decelerations: Absent Uterine activityFrequency: Every 5-7 minutes Dilation: 2 Effacement (%): 50 Station: -3 Exam by:: Ma Hillock CNM   Prenatal labs: ABO, Rh: A/Positive/-- (09/26 1002) Antibody: Negative (09/26 1002) Rubella: 1.03 (09/26 1002) RPR: Non Reactive (01/07 1629)  HBsAg: Negative (09/26 1002)  HIV: Non Reactive (01/07 1629)  GBS: Negative  Prenatal Transfer Tool  Maternal Diabetes: Yes:  Diabetes Type:  Pre-pregnancy, Insulin/Medication controlled Genetic Screening: Normal Maternal Ultrasounds/Referrals: Normal Fetal Ultrasounds or other Referrals:  None Maternal Substance Abuse:  No Significant Maternal Medications:  Meds include: Other: Insulin Significant Maternal Lab Results: Lab values include: Group B Strep negative  No results found for this or any previous visit (from the past 24 hour(s)).  Patient Active Problem List   Diagnosis Date Noted  . Variable fetal heart rate decelerations, antepartum 01/28/2018  . Pregnancy complicated by noncompliance, antepartum 12/28/2017  . Hypertension during pregnancy 08/10/2017  . Atypical squamous cell changes of undetermined significance (ASCUS) on cervical cytology with positive high risk human papilloma virus (HPV) 08/10/2017  . Hx of preeclampsia, prior pregnancy, currently pregnant 08/09/2017  . Supervision of high risk pregnancy, antepartum 08/04/2017  . Obesity in pregnancy 08/04/2017  . Type 2 diabetes mellitus (HCC) 08/04/2017  . Type 2 diabetes mellitus in pregnancy  06/29/2017  . Depression 12/18/2013    Assessment/Plan:  Jennifer Montes is a 27 y.o. G2P1001 at [redacted]w[redacted]d here for IOL for NRFHT and uncontrolled Type 2 diabetes  #Labor: Pitocin for induction of labor #Pain: Plans epidural #FWB: Cat 1 #ID:  GBS neg #MOF: Breast #MOC: None, counseled on options but desires repeat pregnancy soon after delivery #Circ:  n/a  Rolm Bookbinder, CNM  01/28/2018, 12:54 PM

## 2018-01-29 ENCOUNTER — Other Ambulatory Visit: Payer: Self-pay

## 2018-01-29 ENCOUNTER — Encounter (HOSPITAL_COMMUNITY): Payer: Self-pay | Admitting: Neonatal-Perinatal Medicine

## 2018-01-29 DIAGNOSIS — Z3A37 37 weeks gestation of pregnancy: Secondary | ICD-10-CM

## 2018-01-29 DIAGNOSIS — E119 Type 2 diabetes mellitus without complications: Secondary | ICD-10-CM

## 2018-01-29 DIAGNOSIS — O2412 Pre-existing diabetes mellitus, type 2, in childbirth: Secondary | ICD-10-CM

## 2018-01-29 LAB — CBC
HCT: 33.4 % — ABNORMAL LOW (ref 36.0–46.0)
Hemoglobin: 11 g/dL — ABNORMAL LOW (ref 12.0–15.0)
MCH: 22.5 pg — AB (ref 26.0–34.0)
MCHC: 32.9 g/dL (ref 30.0–36.0)
MCV: 68.3 fL — AB (ref 78.0–100.0)
PLATELETS: 183 10*3/uL (ref 150–400)
RBC: 4.89 MIL/uL (ref 3.87–5.11)
RDW: 15.6 % — ABNORMAL HIGH (ref 11.5–15.5)
WBC: 14.7 10*3/uL — ABNORMAL HIGH (ref 4.0–10.5)

## 2018-01-29 LAB — COMPREHENSIVE METABOLIC PANEL
ALBUMIN: 2.3 g/dL — AB (ref 3.5–5.0)
ALK PHOS: 109 U/L (ref 38–126)
ALT: 11 U/L — AB (ref 14–54)
AST: 24 U/L (ref 15–41)
Anion gap: 12 (ref 5–15)
BUN: 6 mg/dL (ref 6–20)
CHLORIDE: 105 mmol/L (ref 101–111)
CO2: 16 mmol/L — AB (ref 22–32)
CREATININE: 0.63 mg/dL (ref 0.44–1.00)
Calcium: 8.2 mg/dL — ABNORMAL LOW (ref 8.9–10.3)
GFR calc non Af Amer: >60 mL/min (ref >60)
GLUCOSE: 177 mg/dL — AB (ref 65–99)
Potassium: 3.8 mmol/L (ref 3.5–5.1)
SODIUM: 133 mmol/L — AB (ref 135–145)
Total Bilirubin: 0.5 mg/dL (ref 0.3–1.2)
Total Protein: 5.7 g/dL — ABNORMAL LOW (ref 6.5–8.1)

## 2018-01-29 LAB — GLUCOSE, CAPILLARY
GLUCOSE-CAPILLARY: 107 mg/dL — AB (ref 65–99)
GLUCOSE-CAPILLARY: 110 mg/dL — AB (ref 65–99)
GLUCOSE-CAPILLARY: 199 mg/dL — AB (ref 65–99)
GLUCOSE-CAPILLARY: 83 mg/dL (ref 65–99)
GLUCOSE-CAPILLARY: 89 mg/dL (ref 65–99)
Glucose-Capillary: 110 mg/dL — ABNORMAL HIGH (ref 65–99)
Glucose-Capillary: 95 mg/dL (ref 65–99)
Glucose-Capillary: 90 mg/dL (ref 65–99)

## 2018-01-29 LAB — URINALYSIS, ROUTINE W REFLEX MICROSCOPIC
Bilirubin Urine: NEGATIVE
Glucose, UA: >=500 mg/dL — AB
KETONES UR: 20 mg/dL — AB
Nitrite: NEGATIVE
PROTEIN: 100 mg/dL — AB
SQUAMOUS EPITHELIAL / LPF: NONE SEEN
Specific Gravity, Urine: 1.01 (ref 1.005–1.030)
pH: 5 (ref 5.0–8.0)

## 2018-01-29 LAB — PROTEIN / CREATININE RATIO, URINE
Creatinine, Urine: 94 mg/dL
PROTEIN CREATININE RATIO: 0.97 mg/mg{creat} — AB (ref 0.00–0.15)
Total Protein, Urine: 91 mg/dL

## 2018-01-29 LAB — RPR: RPR: NONREACTIVE

## 2018-01-29 MED ORDER — COCONUT OIL OIL: 1.0000 "application " | TOPICAL_OIL | Status: DC | PRN

## 2018-01-29 MED ORDER — IBUPROFEN 600 MG PO TABS
600.0000 mg | ORAL_TABLET | Freq: Four times a day (QID) | ORAL | Status: DC
  Administered 2018-01-29 – 2018-01-31 (×9): 600 mg via ORAL
  Filled 2018-01-29 (×9): qty 1

## 2018-01-29 MED ORDER — ZOLPIDEM TARTRATE 5 MG PO TABS: 5.0000 mg | ORAL_TABLET | Freq: Every evening | ORAL | Status: DC | PRN

## 2018-01-29 MED ORDER — ONDANSETRON HCL 4 MG/2ML IJ SOLN: 4.0000 mg | INTRAMUSCULAR | Status: DC | PRN

## 2018-01-29 MED ORDER — SIMETHICONE 80 MG PO CHEW: 80.0000 mg | CHEWABLE_TABLET | ORAL | Status: DC | PRN

## 2018-01-29 MED ORDER — ONDANSETRON HCL 4 MG PO TABS: 4.0000 mg | ORAL_TABLET | ORAL | Status: DC | PRN

## 2018-01-29 MED ORDER — WITCH HAZEL-GLYCERIN EX PADS: 1.0000 "application " | MEDICATED_PAD | CUTANEOUS | Status: DC | PRN

## 2018-01-29 MED ORDER — SENNOSIDES-DOCUSATE SODIUM 8.6-50 MG PO TABS
2.0000 | ORAL_TABLET | ORAL | Status: DC
  Administered 2018-01-29 – 2018-01-31 (×2): 2 via ORAL
  Filled 2018-01-29 (×2): qty 2

## 2018-01-29 MED ORDER — BENZOCAINE-MENTHOL 20-0.5 % EX AERO: 1.0000 "application " | INHALATION_SPRAY | CUTANEOUS | Status: DC | PRN

## 2018-01-29 MED ORDER — MAGNESIUM SULFATE 40 G IN LACTATED RINGERS - SIMPLE
2.0000 g/h | INTRAVENOUS | Status: DC
  Administered 2018-01-30: 2 g/h via INTRAVENOUS
  Filled 2018-01-29: qty 40
  Filled 2018-01-29: qty 500

## 2018-01-29 MED ORDER — LACTATED RINGERS IV SOLN
INTRAVENOUS | Status: DC
  Administered 2018-01-29: 07:00:00 via INTRAUTERINE

## 2018-01-29 MED ORDER — DIBUCAINE 1 % RE OINT: 1.0000 "application " | TOPICAL_OINTMENT | RECTAL | Status: DC | PRN

## 2018-01-29 MED ORDER — PRENATAL MULTIVITAMIN CH
1.0000 | ORAL_TABLET | Freq: Every day | ORAL | Status: DC
  Filled 2018-01-29 (×2): qty 1

## 2018-01-29 MED ORDER — MAGNESIUM SULFATE BOLUS VIA INFUSION
4.0000 g | Freq: Once | INTRAVENOUS | Status: AC
  Administered 2018-01-29: 4 g via INTRAVENOUS
  Filled 2018-01-29: qty 500

## 2018-01-29 MED ORDER — HYDRALAZINE HCL 20 MG/ML IJ SOLN
10.0000 mg | Freq: Once | INTRAMUSCULAR | Status: DC | PRN
  Filled 2018-01-29: qty 1

## 2018-01-29 MED ORDER — DIPHENHYDRAMINE HCL 25 MG PO CAPS: 25.0000 mg | ORAL_CAPSULE | Freq: Four times a day (QID) | ORAL | Status: DC | PRN

## 2018-01-29 MED ORDER — LACTATED RINGERS IV SOLN
INTRAVENOUS | Status: DC
Start: 2018-01-29 — End: 2018-01-31
  Administered 2018-01-29: 19:00:00 via INTRAVENOUS

## 2018-01-29 MED ORDER — LABETALOL HCL 5 MG/ML IV SOLN
20.0000 mg | INTRAVENOUS | Status: AC | PRN
  Administered 2018-01-29: 20 mg via INTRAVENOUS
  Administered 2018-01-29: 40 mg via INTRAVENOUS
  Administered 2018-01-29: 80 mg via INTRAVENOUS
  Filled 2018-01-29: qty 4
  Filled 2018-01-29: qty 8
  Filled 2018-01-29: qty 16

## 2018-01-29 MED ORDER — ACETAMINOPHEN 325 MG PO TABS
650.0000 mg | ORAL_TABLET | ORAL | Status: DC | PRN
Start: 2018-01-29 — End: 2018-01-31

## 2018-01-29 MED ORDER — TETANUS-DIPHTH-ACELL PERTUSSIS 5-2.5-18.5 LF-MCG/0.5 IM SUSP: 0.5000 mL | Freq: Once | INTRAMUSCULAR | Status: DC

## 2018-01-29 NOTE — Anesthesia Postprocedure Evaluation (Signed)
Anesthesia Post Note  Patient: Heritage managerChynna Montes  Procedure(s) Performed: AN AD HOC LABOR EPIDURAL     Patient location during evaluation: [facility] Anesthesia Type: Epidural Level of consciousness: awake and alert, oriented and patient cooperative Pain management: pain level controlled Vital Signs Assessment: post-procedure vital signs reviewed and stable Respiratory status: spontaneous breathing Cardiovascular status: stable Postop Assessment: no headache, epidural receding, patient able to bend at knees and no signs of nausea or vomiting Anesthetic complications: no Comments: Pain score 0.    Last Vitals:  Vitals:   01/29/18 1320 01/29/18 1420  BP:    Pulse:    Resp: 17 20  Temp: 36.8 C   SpO2:      Last Pain:  Vitals:   01/29/18 1320  TempSrc: Oral  PainSc:    Pain Goal:                 [facility],Jennifer Montes

## 2018-01-29 NOTE — Progress Notes (Signed)
LABOR PROGRESS NOTE  Jennifer Montes is a 27 y.o. G2P1001 at 9339w5d  admitted for IOL for NRFHT and uncontrolled T2Dm.  Subjective: Patient doing well, comfortable with epidural  Objective: BP 131/69   Pulse 81   Temp 98.2 F (36.8 C) (Axillary)   Resp 17   Ht 5\' 4"  (1.626 m)   Wt 248 lb 12.8 oz (112.9 kg)   LMP 05/04/2017 (Exact Date)   SpO2 100%   BMI 42.71 kg/m  or  Vitals:   01/29/18 0131 01/29/18 0200 01/29/18 0230 01/29/18 0300  BP: 126/72 137/68 140/75 131/69  Pulse: 80 81 82 81  Resp: 17 17 17    Temp:  98.2 F (36.8 C)    TempSrc:  Axillary    SpO2:      Weight:      Height:         Dilation: 9 Effacement (%): 90 Station: -1 Presentation: Vertex Exam by:: Candy SledgeH. Hardy, Rn; Darrow BussingLiz Poore, RN FHT: baseline rate 150, moderate varibility, + acel, variable decels Toco: ctx q 2-3  CBG (last 3)  Recent Labs    01/29/18 0103 01/29/18 0201 01/29/18 0301  GLUCAP 110* 83 89     Assessment / Plan: 27 y.o. G2P1001 at 5139w5d here for IOL  for NRFHT and uncontrolled T2Dm.   Labor: Progressing well Fetal Wellbeing:  Cat II (variable decels but good variability and +acels Pain Control:  epidural DM2:  On glucostabilizer. CBGs at goal now. Continue to monitor CBGs q1 hr  Frederik PearJulie P Gerilyn Stargell, MD 01/29/2018, 3:33 AM

## 2018-01-30 ENCOUNTER — Encounter (HOSPITAL_COMMUNITY): Payer: Self-pay

## 2018-01-30 LAB — COMPREHENSIVE METABOLIC PANEL
ALBUMIN: 2.3 g/dL — AB (ref 3.5–5.0)
ALK PHOS: 102 U/L (ref 38–126)
ALT: 11 U/L — ABNORMAL LOW (ref 14–54)
AST: 22 U/L (ref 15–41)
Anion gap: 10 (ref 5–15)
BILIRUBIN TOTAL: 0.3 mg/dL (ref 0.3–1.2)
BUN: 6 mg/dL (ref 6–20)
CALCIUM: 8.2 mg/dL — AB (ref 8.9–10.3)
CO2: 21 mmol/L — ABNORMAL LOW (ref 22–32)
Chloride: 103 mmol/L (ref 101–111)
Creatinine, Ser: 0.59 mg/dL (ref 0.44–1.00)
GFR calc Af Amer: >60 mL/min (ref >60)
GLUCOSE: 229 mg/dL — AB (ref 65–99)
Potassium: 3.6 mmol/L (ref 3.5–5.1)
Sodium: 134 mmol/L — ABNORMAL LOW (ref 135–145)
TOTAL PROTEIN: 6 g/dL — AB (ref 6.5–8.1)

## 2018-01-30 LAB — GLUCOSE, CAPILLARY
GLUCOSE-CAPILLARY: 127 mg/dL — AB (ref 65–99)
Glucose-Capillary: 130 mg/dL — ABNORMAL HIGH (ref 65–99)

## 2018-01-30 LAB — CBC
HEMATOCRIT: 31.5 % — AB (ref 36.0–46.0)
HEMOGLOBIN: 10.3 g/dL — AB (ref 12.0–15.0)
MCH: 22.5 pg — AB (ref 26.0–34.0)
MCHC: 32.7 g/dL (ref 30.0–36.0)
MCV: 68.8 fL — AB (ref 78.0–100.0)
Platelets: 231 10*3/uL (ref 150–400)
RBC: 4.58 MIL/uL (ref 3.87–5.11)
RDW: 15.7 % — ABNORMAL HIGH (ref 11.5–15.5)
WBC: 15.6 10*3/uL — ABNORMAL HIGH (ref 4.0–10.5)

## 2018-01-30 LAB — MAGNESIUM: MAGNESIUM: 3.4 mg/dL — AB (ref 1.7–2.4)

## 2018-01-30 MED ORDER — LACTATED RINGERS IV SOLN: INTRAVENOUS | Status: DC

## 2018-01-30 NOTE — Discharge Summary (Addendum)
Physician Discharge Summary  Patient ID: Jennifer Montes MRN: [id number] DOB/AGE: 05/14/1991 27 y.o.  Admit date: 01/28/2018 Discharge date: 01/31/2018  Discharge Diagnoses:  Principal Problem:   SVD (spontaneous vaginal delivery) Active Problems:   Type 2 diabetes mellitus in pregnancy   Supervision of high risk pregnancy, antepartum   Hx of preeclampsia, prior pregnancy, currently pregnant   Hypertension during pregnancy   Atypical squamous cell changes of undetermined significance (ASCUS) on cervical cytology with positive high risk human papilloma virus (HPV)   Variable fetal heart rate decelerations, antepartum   Reason for Admission: induction of labor Prenatal Procedures: NST and ultrasound Intrapartum Procedures: spontaneous vaginal delivery Postpartum Procedures: MgSO4 Complications-Operative and Postpartum: post partum pre-eclampsai    Hospital Course: Please see HPI dated 01/28/2018 for details. This is a 27 y.o. G2P2002 now PPD#2 from a spontaneous vaginal delivery after induction for non-reassuring fetal tracing. Her antepartum course was complicated by Type 2 DM which she was non-compliant with meds and prenatal care. Her post partum course was complicated by pre-eclampsia with severe features requiring magnesium. By day 2, she was ambulating, passing flatus and pain was well controlled. She was discharged home PPD#2 in good condition. Baby remains in NICU and is doing well. Instructions for follow up given. She will follow up in 3 days for BP check, 4 weeks for post partum check. To cont diet control of T2DM.   Physical exam  Vitals:   01/30/18 1359 01/30/18 1630 01/30/18 1940 01/31/18 0508  BP: (!) 155/76 134/86 128/90 (!) 122/55  Pulse: 71 81 76 79  Resp: 18 18 16 18   Temp: 97.7 F (36.5 C) 98.8 F (37.1 C) (!) 97.5 F (36.4 C) 98.9 F (37.2 C)  TempSrc: Oral Oral Oral Oral  SpO2: 100% 100% 98% 99%  Weight:    244 lb 0.4 oz (110.7 kg)  Height:        Physical Exam:  General: alert, oriented, cooperative Chest: CTAB, normal respiratory effort Heart: RRR  Abdomen: +BS, soft, appropriately tender to palpation Uterine Fundus: firm, 2 fingers below the umbilicus Lochia: moderate, rubra DVT Evaluation: no evidence of DVT Extremities: no edema, no calf tenderness   Postpartum contraception: declines  Disposition: home  Discharged Condition: good  Discharge Instructions    Call MD for:  difficulty breathing, headache or visual disturbances   Complete by:  As directed    Call MD for:  extreme fatigue   Complete by:  As directed    Call MD for:  hives   Complete by:  As directed    Call MD for:  persistant dizziness or light-headedness   Complete by:  As directed    Call MD for:  persistant nausea and vomiting   Complete by:  As directed    Call MD for:  redness, tenderness, or signs of infection (pain, swelling, redness, odor or green/yellow discharge around incision site)   Complete by:  As directed    Call MD for:  severe uncontrolled pain   Complete by:  As directed    Call MD for:  temperature >100.4   Complete by:  As directed    Diet - low sodium heart healthy   Complete by:  As directed    Sexual acrtivity   Complete by:  As directed    Pelvic rest for six weeks.     Allergies as of 01/31/2018   No Known Allergies     Medication List    STOP taking these medications  acetaminophen 325 MG tablet Commonly known as:  TYLENOL   Insulin Detemir 100 UNIT/ML Pen Commonly known as:  LEVEMIR FLEXTOUCH   insulin lispro 100 UNIT/ML KiwkPen Commonly known as:  HUMALOG KWIKPEN     TAKE these medications   ibuprofen 600 MG tablet Commonly known as:  ADVIL,MOTRIN Take 1 tablet (600 mg total) by mouth every 6 (six) hours.   prenatal multivitamin Tabs tablet Take 1 tablet by mouth daily at 12 noon.      Follow-up Information    [facility]. Schedule an appointment as  soon as possible for a visit in 4 week(s).   Specialty:  Obstetrics and Gynecology Why:  The office should call you to make a blood pressure check for later this week, please call the office if you do not hear from them. Contact information: [address] [place] [phone]           Signed: Conan BowensKelly M Nettye Flegal 01/31/2018, 7:28 AM

## 2018-01-30 NOTE — Progress Notes (Signed)
Faculty Attending Note  Post Partum Day 1  Subjective: Patient is feeling well. She reports well controlled pain on PO pain meds. She is ambulating and denies light-headedness or dizziness. She is passing flatus. She is tolerating a regular diet without nausea/vomiting. Bleeding is moderate. She is breast feeding. Baby is in NICU with NG tube and doing well.  Objective: Blood pressure 126/73, pulse 85, temperature 98.1 F (36.7 C), temperature source Oral, resp. rate 18, height 5\' 4"  (1.626 m), weight 241 lb 8 oz (109.5 kg), last menstrual period 05/04/2017, SpO2 98 %, unknown if currently breastfeeding. Temp:  [97.8 F (36.6 C)-100.3 F (37.9 C)] 98.1 F (36.7 C) (03/24 0741) Pulse Rate:  [77-111] 85 (03/24 0741) Resp:  [16-20] 18 (03/24 0741) BP: (112-170)/(64-86) 126/73 (03/24 0741) SpO2:  [96 %-100 %] 98 % (03/24 0741) Weight:  [241 lb 8 oz (109.5 kg)] 241 lb 8 oz (109.5 kg) (03/24 0527)  Physical Exam:  General: alert, oriented, cooperative Chest: CTAB, normal respiratory effort Heart: RRR  Abdomen: +BS, soft, appropriately tender to palpation  Uterine Fundus: firm, 2 fingers below the umbilicus Lochia: moderate, rubra DVT Evaluation: no evidence of DVT Extremities: no edema, no calf tenderness   Current Facility-Administered Medications:  .  acetaminophen (TYLENOL) tablet 650 mg, 650 mg, Oral, Q4H PRN, Degele, Kandra NicolasJulie P, MD .  benzocaine-Menthol (DERMOPLAST) 20-0.5 % topical spray 1 application, 1 application, Topical, PRN, Degele, Kandra NicolasJulie P, MD .  coconut oil, 1 application, Topical, PRN, Degele, Kandra NicolasJulie P, MD .  witch hazel-glycerin (TUCKS) pad 1 application, 1 application, Topical, PRN **AND** dibucaine (NUPERCAINAL) 1 % rectal ointment 1 application, 1 application, Rectal, PRN, Degele, Kandra NicolasJulie P, MD .  diphenhydrAMINE (BENADRYL) capsule 25 mg, 25 mg, Oral, Q6H PRN, Degele, Kandra NicolasJulie P, MD .  hydrALAZINE (APRESOLINE) injection 10 mg, 10 mg, Intravenous, Once PRN, Degele, Kandra NicolasJulie P,  MD .  ibuprofen (ADVIL,MOTRIN) tablet 600 mg, 600 mg, Oral, Q6H, Degele, Kandra NicolasJulie P, MD, 600 mg at 01/30/18 0526 .  lactated ringers infusion, , Intravenous, Continuous, Constant, Peggy, MD, Last Rate: 100 mL/hr at 01/29/18 1900 .  magnesium sulfate 40 grams in LR 500 mL OB infusion, 2 g/hr, Intravenous, Continuous, Degele, Kandra NicolasJulie P, MD, Last Rate: 25 mL/hr at 01/30/18 0449, 2 g/hr at 01/30/18 0449 .  ondansetron (ZOFRAN) tablet 4 mg, 4 mg, Oral, Q4H PRN **OR** ondansetron (ZOFRAN) injection 4 mg, 4 mg, Intravenous, Q4H PRN, Degele, Kandra NicolasJulie P, MD .  prenatal multivitamin tablet 1 tablet, 1 tablet, Oral, Q1200, Degele, Kandra NicolasJulie P, MD .  senna-docusate (Senokot-S) tablet 2 tablet, 2 tablet, Oral, Q24H, Degele, Kandra NicolasJulie P, MD, 2 tablet at 01/29/18 2338 .  simethicone (MYLICON) chewable tablet 80 mg, 80 mg, Oral, PRN, Degele, Kandra NicolasJulie P, MD .  Tdap (BOOSTRIX) injection 0.5 mL, 0.5 mL, Intramuscular, Once, Degele, Kandra NicolasJulie P, MD .  zolpidem (AMBIEN) tablet 5 mg, 5 mg, Oral, QHS PRN, Degele, Kandra NicolasJulie P, MD Recent Labs    01/28/18 1353 01/29/18 0858  HGB 10.6* 11.0*  HCT 32.6* 33.4*    Assessment/Plan: Plan for discharge tomorrow  Patient is 27 y.o. [id number] PPD#1 s/p SVD at 881w5d who developed pre-eclampsia with severe features post partum and was placed on magnesium. Now s/p MgSO4. Has been down in NICU all am visiting infant. She is doing very well, recovering appropriately and complains only of minimal cramping. Does report she is diet controlled Type 2 DM, will resume diet control and check CBGs while in house. BG just now 130  Continue routine post  partum care BP stable CBGs 4x daily Pain meds prn Regular diet declines birth control for now Plan for discharge likely tomorrow   Conan Bowens 01/30/2018, 8:46 AM

## 2018-01-30 NOTE — Progress Notes (Signed)
Patient screened out for psychosocial assessment since none of the following apply: °Psychosocial stressors documented in mother or baby's chart °Gestation less than 32 weeks °Code at delivery  °Infant with anomalies °Please contact the Clinical Social Worker if specific needs arise, by MOB's request, or if MOB scores greater than 9/yes to question 10 on Edinburgh Postpartum Depression Screen. ° °Kendalynn Wideman Boyd-Gilyard, MSW, LCSW °Clinical Social Work °[phone] °  °

## 2018-01-30 NOTE — Lactation Note (Signed)
This note was copied from a baby'Montes chart. Lactation Consultation Note  Patient Name: Jennifer Montes [name] Date: 01/30/2018 Reason for consult: Initial assessment;NICU baby;Early term 37-38.6wks Breastfeeding consultation services and support information and Providing Breastmilk For Your Baby In NICU book given to mom.  Mom initiated pumping yesterday and obtained 5-10 mls of colostrum.  She has not pumped today because she was visiting baby in NICU.  Stressed importance of pumping and hand expression 8-12 times/24 hours to establish and maintain milk supply.  She plans on obtaining a pump from insurance but will most likely rent in the interim.  Mom states she is comfortable with pumping.  Milk coming to volume discussed.  Encouraged to call for assist prn.  Maternal Data Has patient been taught Hand Expression?: Yes  Feeding Feeding Type: Formula Nipple Type: Slow - flow Length of feed: 10 min  LATCH Score                   Interventions    Lactation Tools Discussed/Used WIC Program: No Pump Review: Setup, frequency, and cleaning Initiated by:: RN Date initiated:: 01/29/18   Consult Status Consult Status: Follow-up Date: 01/31/18 Follow-up type: In-patient    Jennifer FoleyMOULDEN, Jennifer Montes 01/30/2018, 2:20 PM

## 2018-01-31 LAB — GLUCOSE, CAPILLARY
GLUCOSE-CAPILLARY: 107 mg/dL — AB (ref 65–99)
Glucose-Capillary: 108 mg/dL — ABNORMAL HIGH (ref 65–99)

## 2018-01-31 MED ORDER — IBUPROFEN 600 MG PO TABS: 600.0000 mg | ORAL_TABLET | Freq: Four times a day (QID) | ORAL | 0 refills | Status: DC

## 2018-01-31 NOTE — Discharge Instructions (Signed)
Vaginal Delivery, Care After °Refer to this sheet in the next few weeks. These instructions provide you with information about caring for yourself after vaginal delivery. Your health care provider may also give you more specific instructions. Your treatment has been planned according to current medical practices, but problems sometimes occur. Call your health care provider if you have any problems or questions. °What can I expect after the procedure? °After vaginal delivery, it is common to have: °· Some bleeding from your vagina. °· Soreness in your abdomen, your vagina, and the area of skin between your vaginal opening and your anus (perineum). °· Pelvic cramps. °· Fatigue. ° °Follow these instructions at home: °Medicines °· Take over-the-counter and prescription medicines only as told by your health care provider. °· If you were prescribed an antibiotic medicine, take it as told by your health care provider. Do not stop taking the antibiotic until it is finished. °Driving ° °· Do not drive or operate heavy machinery while taking prescription pain medicine. °· Do not drive for 24 hours if you received a sedative. °Lifestyle °· Do not drink alcohol. This is especially important if you are breastfeeding or taking medicine to relieve pain. °· Do not use tobacco products, including cigarettes, chewing tobacco, or e-cigarettes. If you need help quitting, ask your health care provider. °Eating and drinking °· Drink at least 8 eight-ounce glasses of water every day unless you are told not to by your health care provider. If you choose to breastfeed your baby, you may need to drink more water than this. °· Eat high-fiber foods every day. These foods may help prevent or relieve constipation. High-fiber foods include: °? Whole grain cereals and breads. °? Brown rice. °? Beans. °? Fresh fruits and vegetables. °Activity °· Return to your normal activities as told by your health care provider. Ask your health care provider  what activities are safe for you. °· Rest as much as possible. Try to rest or take a nap when your baby is sleeping. °· Do not lift anything that is heavier than your baby or 10 lb (4.5 kg) until your health care provider says that it is safe. °· Talk with your health care provider about when you can engage in sexual activity. This may depend on your: °? Risk of infection. °? Rate of healing. °? Comfort and desire to engage in sexual activity. °Vaginal Care °· If you have an episiotomy or a vaginal tear, check the area every day for signs of infection. Check for: °? More redness, swelling, or pain. °? More fluid or blood. °? Warmth. °? Pus or a bad smell. °· Do not use tampons or douches until your health care provider says this is safe. °· Watch for any blood clots that may pass from your vagina. These may look like clumps of dark red, brown, or black discharge. °General instructions °· Keep your perineum clean and dry as told by your health care provider. °· Wear loose, comfortable clothing. °· Wipe from front to back when you use the toilet. °· Ask your health care provider if you can shower or take a bath. If you had an episiotomy or a perineal tear during labor and delivery, your health care provider may tell you not to take baths for a certain length of time. °· Wear a bra that supports your breasts and fits you well. °· If possible, have someone help you with household activities and help care for your baby for at least a few days after   you leave the hospital. °· Keep all follow-up visits for you and your baby as told by your health care provider. This is important. °Contact a health care provider if: °· You have: °? Vaginal discharge that has a bad smell. °? Difficulty urinating. °? Pain when urinating. °? A sudden increase or decrease in the frequency of your bowel movements. °? More redness, swelling, or pain around your episiotomy or vaginal tear. °? More fluid or blood coming from your episiotomy or  vaginal tear. °? Pus or a bad smell coming from your episiotomy or vaginal tear. °? A fever. °? A rash. °? Little or no interest in activities you used to enjoy. °? Questions about caring for yourself or your baby. °· Your episiotomy or vaginal tear feels warm to the touch. °· Your episiotomy or vaginal tear is separating or does not appear to be healing. °· Your breasts are painful, hard, or turn red. °· You feel unusually sad or worried. °· You feel nauseous or you vomit. °· You pass large blood clots from your vagina. If you pass a blood clot from your vagina, save it to show to your health care provider. Do not flush blood clots down the toilet without having your health care provider look at them. °· You urinate more than usual. °· You are dizzy or light-headed. °· You have not breastfed at all and you have not had a menstrual period for 12 weeks after delivery. °· You have stopped breastfeeding and you have not had a menstrual period for 12 weeks after you stopped breastfeeding. °Get help right away if: °· You have: °? Pain that does not go away or does not get better with medicine. °? Chest pain. °? Difficulty breathing. °? Blurred vision or spots in your vision. °? Thoughts about hurting yourself or your baby. °· You develop pain in your abdomen or in one of your legs. °· You develop a severe headache. °· You faint. °· You bleed from your vagina so much that you fill two sanitary pads in one hour. °This information is not intended to replace advice given to you by your health care provider. Make sure you discuss any questions you have with your health care provider. °Document Released: 10/23/2000 Document Revised: 04/08/2016 Document Reviewed: 11/10/2015 °Elsevier Interactive Patient Education © 2018 [employer] ° °

## 2018-01-31 NOTE — Progress Notes (Signed)
Discharge teaching complete with pt. Pt understood all information and did not have nay questions. Pt discharged home to family.

## 2018-01-31 NOTE — Progress Notes (Addendum)
2200: We were unable to take 2200 hr CBG because pt was in NICU visiting her baby. We will take same upon her return.  0150: Pt returned to Unit CBG 107 mg/dl.

## 2018-02-01 ENCOUNTER — Other Ambulatory Visit: Payer: BLUE CROSS/BLUE SHIELD

## 2018-02-01 ENCOUNTER — Encounter: Payer: BLUE CROSS/BLUE SHIELD | Admitting: Advanced Practice Midwife

## 2018-02-04 ENCOUNTER — Encounter: Payer: Self-pay | Admitting: Family Medicine

## 2018-02-04 ENCOUNTER — Ambulatory Visit (INDEPENDENT_AMBULATORY_CARE_PROVIDER_SITE_OTHER): Payer: Medicaid Other | Admitting: Family Medicine

## 2018-02-04 ENCOUNTER — Encounter: Payer: BLUE CROSS/BLUE SHIELD | Admitting: Family Medicine

## 2018-02-04 ENCOUNTER — Ambulatory Visit: Payer: Self-pay

## 2018-02-04 DIAGNOSIS — O10013 Pre-existing essential hypertension complicating pregnancy, third trimester: Secondary | ICD-10-CM

## 2018-02-04 DIAGNOSIS — Z794 Long term (current) use of insulin: Secondary | ICD-10-CM

## 2018-02-04 DIAGNOSIS — E119 Type 2 diabetes mellitus without complications: Secondary | ICD-10-CM

## 2018-02-04 MED ORDER — TRIAMTERENE-HCTZ 50-25 MG PO CAPS: 1.0000 | ORAL_CAPSULE | ORAL | 1 refills | Status: DC

## 2018-02-04 NOTE — Progress Notes (Signed)
Patient here for BP check. Patient has swollen feet.  Armandina StammerJennifer Howard RNBSN

## 2018-02-04 NOTE — Lactation Note (Addendum)
This note was copied from a baby's chart. Lactation Consultation Note  Patient Name: Jennifer Montes EEATV'V Date: 02/04/2018 Reason for consult: Follow-up assessment;NICU baby;Nipple pain/trauma   Called to NICU by RN due to nipple pain with pumping. Mom reports she is using an Evenflo pump at home. She is using the biggest flanges and medium suction. Mom reports her nipple and areola tissue if being pulled deep into barrell. Mom reports her nipples are very painful with pumping. It appears there is a small blister to the tip of the right nipple and noted a dot of blood tinged drainage to left nipple on the breast pad. Nipples otherwise look normal. Mom was wearing breast pads inside out and the adhesive was sticking to her nipple, showed her how to apply properly.   Tried mom's manual pump and feel that size 24 flanges are a good fit for her. Recommended mom change to size 24 flanges and to use coconut oil/olive oil to lubricate nipples before pumping. Keep suction of pump to lower suction and turn up slowly as tolerated. Mom to call back if not improving in 1-2 days.   Mom is not planning to latch infant, she is planning to pump and bottle feed. Let mom know the pumping rooms are available for pumping when visiting infant, mom prefers to go home to pump. She has pump kit for hospital grade pump. Her bra was noted to be too tight, she is planning to get a larger one.    Maternal Data    Feeding    LATCH Score                   Interventions    Lactation Tools Discussed/Used Pump Review: Setup, frequency, and cleaning;Milk Storage Initiated by:: Reviewed and encouraged every 3 hours   Consult Status Consult Status: PRN Follow-up type: Call as needed    Donn Pierini 02/04/2018, 12:46 PM

## 2018-02-04 NOTE — Progress Notes (Signed)
   Subjective:    Patient ID: Jennifer Montes, female    DOB: 03-07-1991, 27 y.o.   MRN: [id number]  HPI Patient 6 days PP from SVD. Here for BP check. Having swelling in ankles. Denies HA, vision changes, etc. Baby still in NICU for blood sugar control.    Review of Systems     Objective:   Physical Exam  Constitutional: She is oriented to person, place, and time. She appears well-developed and well-nourished.  Cardiovascular: Normal rate, regular rhythm and normal heart sounds.  Pulmonary/Chest: Effort normal and breath sounds normal.  Neurological: She is alert and oriented to person, place, and time.  Skin: Skin is warm and dry.  Psychiatric: She has a normal mood and affect. Her behavior is normal. Judgment and thought content normal.       Assessment & Plan:  1. Postpartum care and examination Doing well. PP exam in 3 weeks  2. Pre-existing essential hypertension during pregnancy in third trimester BP elevated. Was not on BP medication prior to pregnancy. No evidence of severe GHTN or preeclampsia. Will treat with dyazide. 1 week f/u with BP check (with RN)  3. Type 2 diabetes mellitus without complication, with long-term current use of insulin (HCC) Blood sugars controlled with diet and exercise prior to pregnancy. Will continue at this point.

## 2018-02-07 ENCOUNTER — Telehealth: Payer: Self-pay

## 2018-02-07 MED ORDER — TRIAMTERENE-HCTZ 37.5-25 MG PO CAPS: 1.0000 | ORAL_CAPSULE | Freq: Every day | ORAL | 1 refills | Status: DC

## 2018-02-07 NOTE — Telephone Encounter (Signed)
-----   Message from Levie HeritageJacob J Stinson, DO sent at 02/07/2018  8:15 AM EDT ----- Regarding: RE: patient Rx Do the 37.5/25mg   ----- Message ----- From: Festus AloeHoward, Damain Broadus Ledbetter, RN Sent: 02/07/2018   8:12 AM To: Levie HeritageJacob J Stinson, DO Subject: patient Rx                                     Hey Dr. Adrian Blackwaterstinson-  [facility] the Triam/HCTZ 50-25 Mg Cap has been discontinued.   They said the strengths they have are 37.5/25 and 75/50.   Please let us know what we can send in.  Candise BowensJen

## 2018-02-10 ENCOUNTER — Other Ambulatory Visit: Payer: BLUE CROSS/BLUE SHIELD

## 2018-03-03 ENCOUNTER — Ambulatory Visit (INDEPENDENT_AMBULATORY_CARE_PROVIDER_SITE_OTHER): Payer: BLUE CROSS/BLUE SHIELD | Admitting: Obstetrics & Gynecology

## 2018-03-03 ENCOUNTER — Encounter: Payer: Self-pay | Admitting: Obstetrics & Gynecology

## 2018-03-03 VITALS — BP 151/99 | HR 93 | Wt 212.0 lb

## 2018-03-03 DIAGNOSIS — Z1389 Encounter for screening for other disorder: Secondary | ICD-10-CM

## 2018-03-03 DIAGNOSIS — O99345 Other mental disorders complicating the puerperium: Secondary | ICD-10-CM

## 2018-03-03 DIAGNOSIS — O165 Unspecified maternal hypertension, complicating the puerperium: Secondary | ICD-10-CM

## 2018-03-03 DIAGNOSIS — F53 Postpartum depression: Secondary | ICD-10-CM

## 2018-03-03 MED ORDER — SERTRALINE HCL 25 MG PO TABS: 25.0000 mg | ORAL_TABLET | Freq: Every day | ORAL | 0 refills | Status: AC

## 2018-03-03 MED ORDER — AMLODIPINE BESYLATE 5 MG PO TABS: 5.0000 mg | ORAL_TABLET | Freq: Every day | ORAL | 2 refills | Status: AC

## 2018-03-03 MED ORDER — SERTRALINE HCL 50 MG PO TABS: 50.0000 mg | ORAL_TABLET | Freq: Every day | ORAL | 6 refills | Status: AC

## 2018-03-03 NOTE — Patient Instructions (Signed)

## 2018-03-03 NOTE — Progress Notes (Signed)
Post Partum Exam  Jennifer KirksChynna Montes is a 27 y.o. [id number] female who presents for a postpartum visit. She is 4 weeks postpartum following a spontaneous vaginal delivery. I have fully reviewed the prenatal and intrapartum course. The delivery was at 37.4 gestational weeks.  Anesthesia: epidural. Postpartum course has been doing well. Baby's course has been doing well. Baby is feeding by bottle - Enfamil-Gentle. Bleeding no bleeding. Bowel function is normal. Bladder function is normal. Patient is not sexually active. Contraception method is abstinence.  Postpartum depression screening:neg, score 0.  Pt reports that she has been feeling tired and only feels that she should keep going because of her daughter. She had a family death of a cousin and her brother is taking it hard. She doesn't feel like she has help with the kids. She Is feeling overwhelmed. She did have some PP depression after her prev pregnancy.   She never took her BP meds post op.   The following portions of the patient's history were reviewed and updated as appropriate: allergies, current medications, past family history, past medical history, past social history, past surgical history and problem list. Last pap smear done 07/2017 and was Abnormal- ASCUS with +HPV  Review of Systems Pertinent items are noted in HPI.    Objective:  Weight 212 lb (96.2 kg), last menstrual period 05/04/2017, unknown if currently breastfeeding. BP (!) 151/99   Pulse 93   Wt 212 lb (96.2 kg)   LMP 05/04/2017 (Exact Date)   BMI 36.39 kg/m   CONSTITUTIONAL: Well-developed, well-nourished female in no acute distress.  HENT:  Normocephalic, atraumatic EYES: Conjunctivae and EOM are normal. No scleral icterus.  NECK: Normal range of motion SKIN: Skin is warm and dry. No rash noted. Not diaphoretic.No pallor. NEUROLGIC: Alert and oriented to person, place, and time. Normal coordination.   Assessment:    4 weeks postpartum exam.  PP depression HTN  affecting the PP state- not on meds    Plan:   1. Contraception: abstinence 2. Norvasc 5mg  po q day 3. Follow up in: 3 weeks or as needed.  4. Zoloft 25 mg daily x 1 week followed by 50mg  daily  5. She needs a 2 hour GTT and will come fasting at her next visit  Makina Skow L. Harraway-Smith, M.D., [facility] CoreFACOG

## 2018-03-18 ENCOUNTER — Ambulatory Visit: Payer: BLUE CROSS/BLUE SHIELD | Admitting: Obstetrics & Gynecology

## 2018-05-15 IMAGING — US US OB COMP LESS 14 WK
1 series · 15 of 28 positions shown · non-contrast
Comparison: None.

CLINICAL DATA: Pain.  Pregnant patient.

EXAM:
OBSTETRIC <14 WK US AND TRANSVAGINAL OB US
TECHNIQUE: Both transabdominal and transvaginal ultrasound examinations were
performed for complete evaluation of the gestation as well as the
maternal uterus, adnexal regions, and pelvic cul-de-sac.
Transvaginal technique was performed to assess early pregnancy.

[Series 1: us ob comp less 14 wk · 15 of 38 slices shown]
[im 1/38]
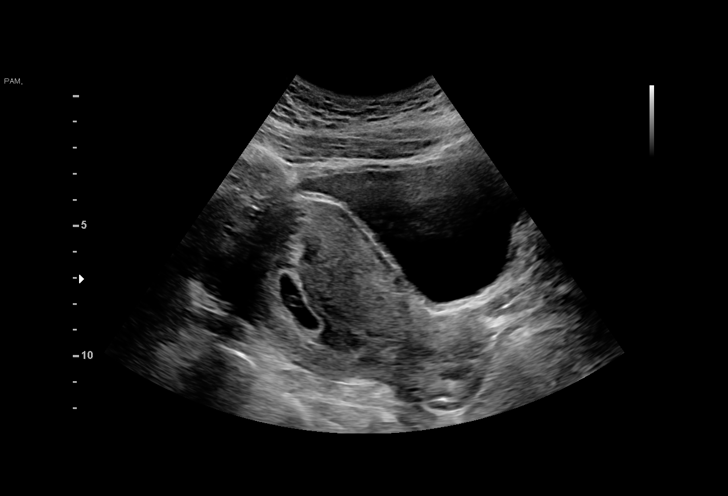
[im 3/38]
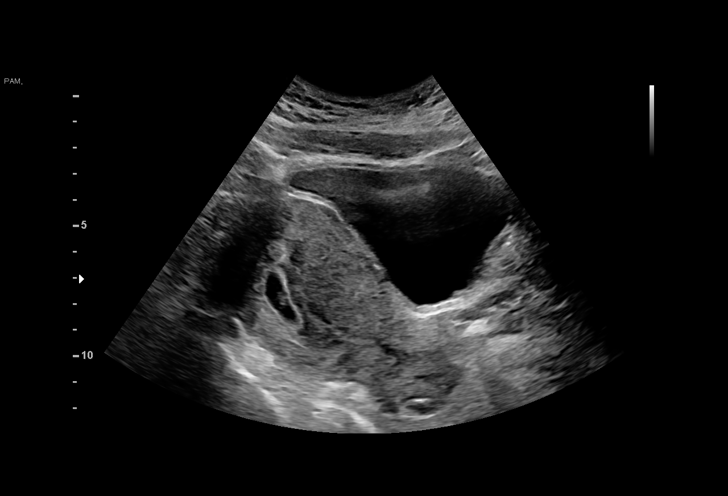
[im 6/38]
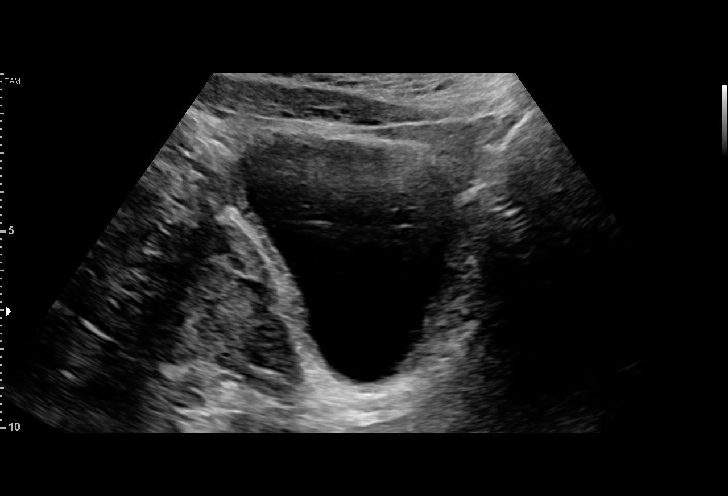
[im 9/38]
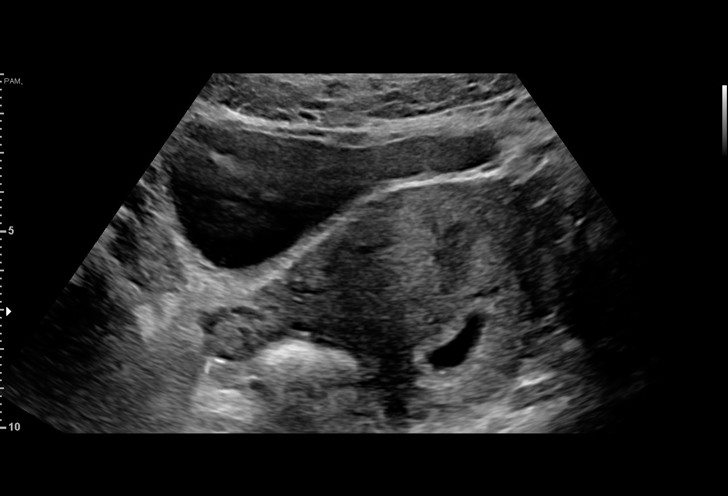
[im 11/38]
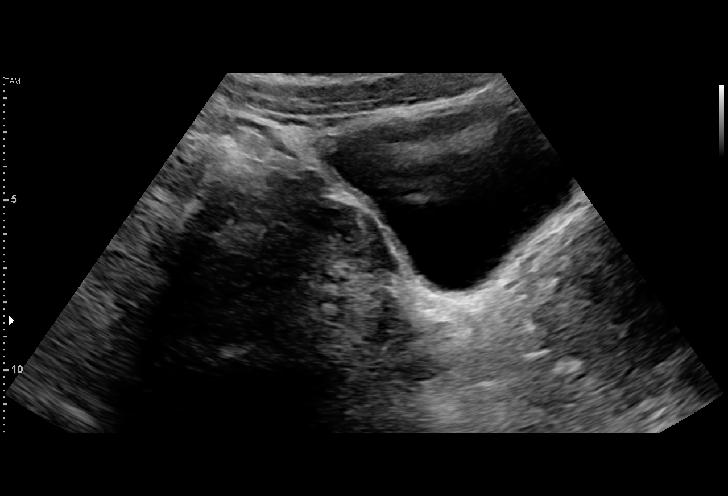
[im 14/38]
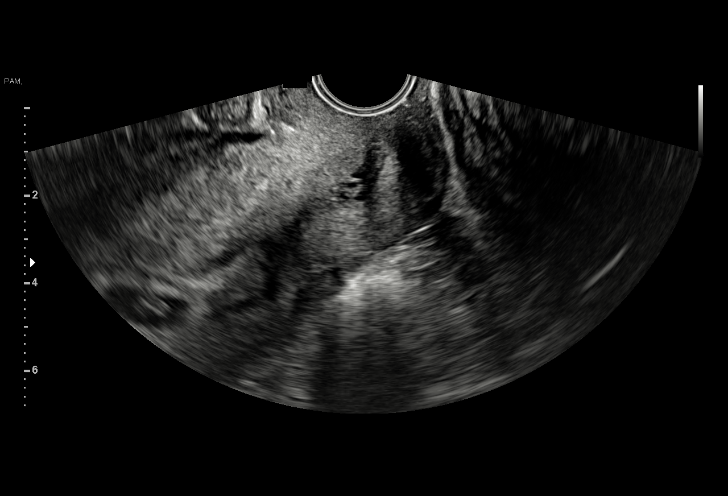
[im 17/38]
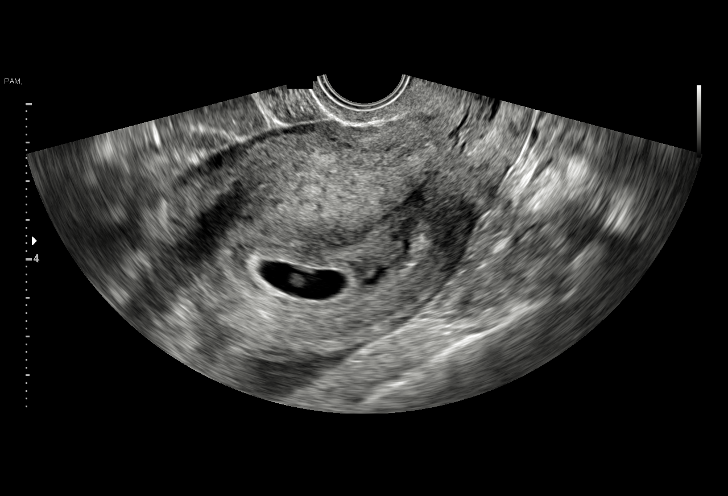
[im 20/38]
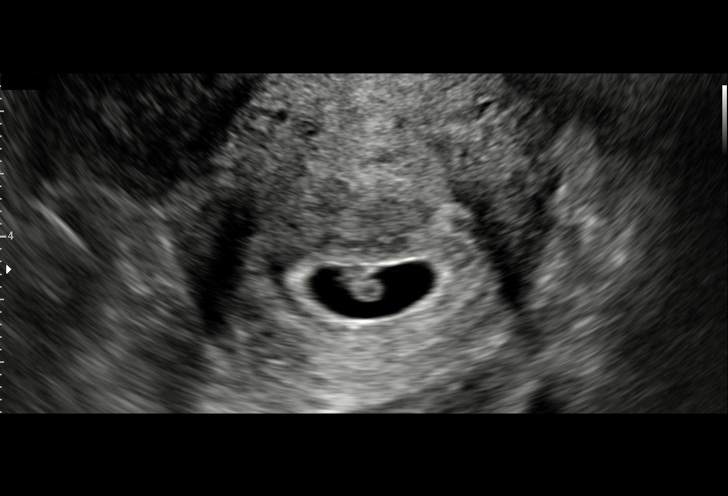
[im 21/38]
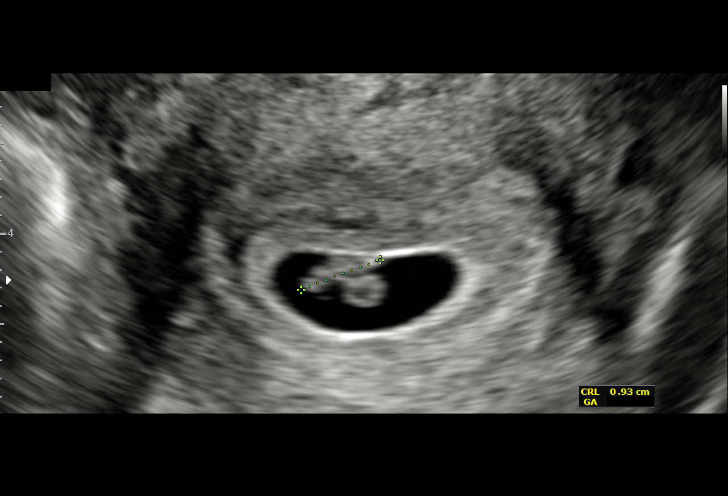
[im 24/38]
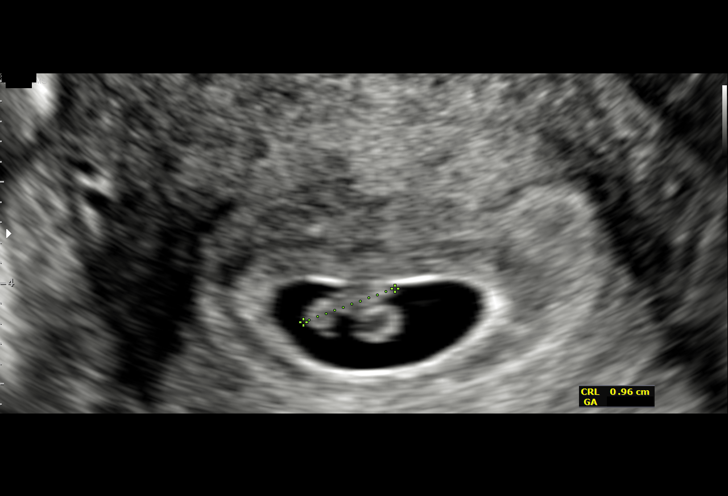
[im 27/38]
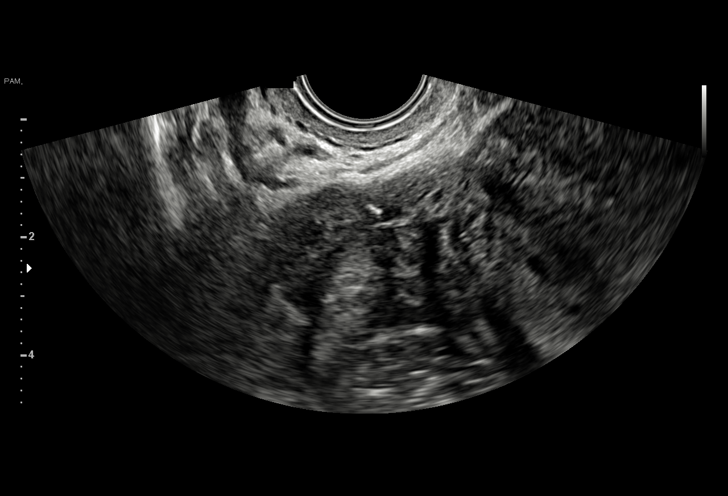
[im 29/38]
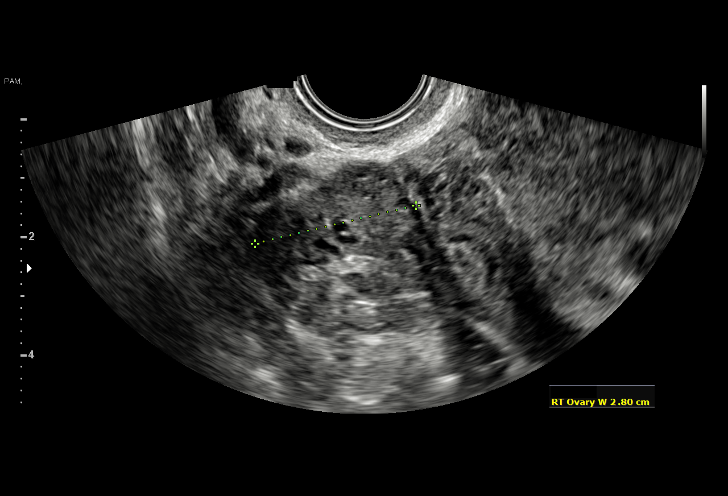
[im 32/38]
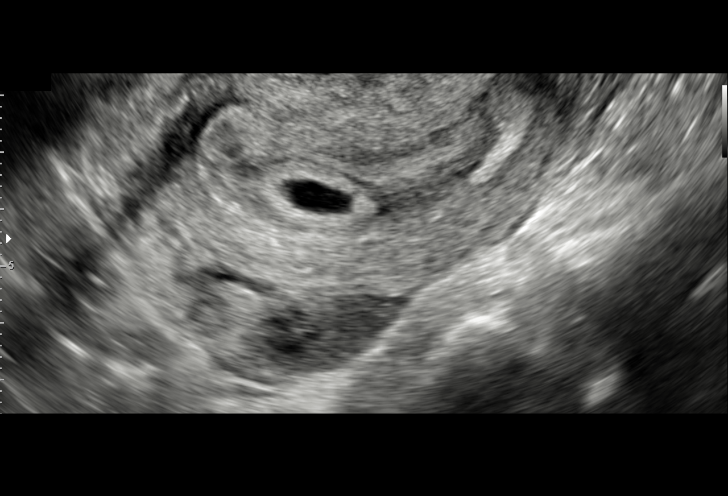
[im 35/38]
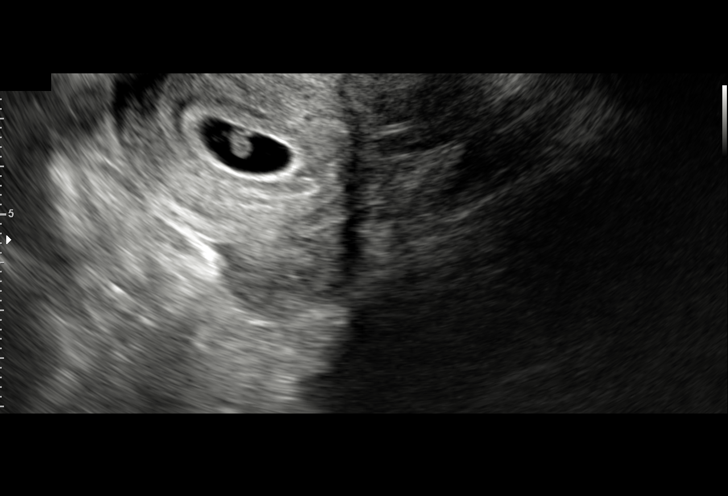
[im 38/38]
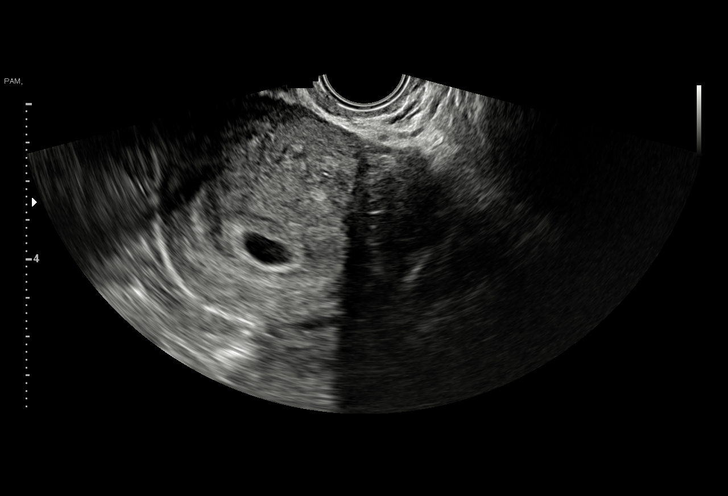

[15 of 28 positions shown; findings below may reference images not displayed]

FINDINGS: Intrauterine gestational sac: Single

Yolk sac:  Visualized.

Embryo:  Visualized.

Cardiac Activity: Visualized.

Heart Rate: 127  bpm

MSD:   mm    w     d

CRL:  9.5  mm   6 w   6 d                  US EDC: February 14, 2018

Subchorionic hemorrhage: There is a small inferior subchorionic
hemorrhage.

Maternal uterus/adnexae: Both ovaries are normal in appearance.
IMPRESSION: 1. Single live IUP.  Small inferior subchorionic hemorrhage.

## 2018-10-18 IMAGING — US US MFM OB FOLLOW-UP
1 series · 14 of 28 positions shown · non-contrast
Comparison: none

[Series 1: us mfm ob follow-up · 46 acquisitions, 14 frames shown]
[im 2/46]
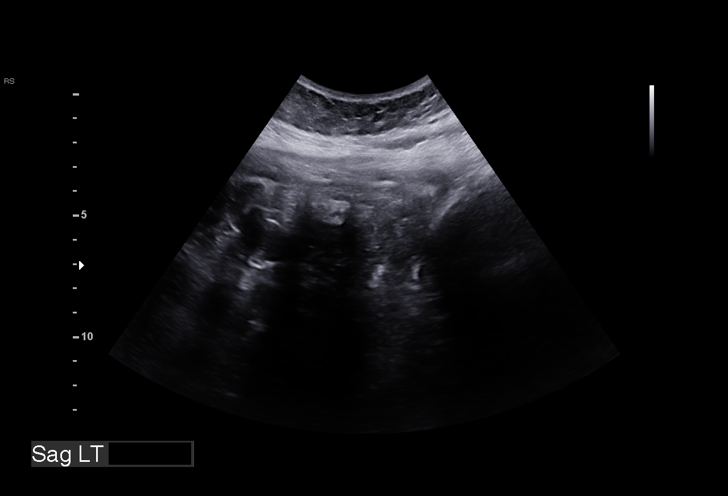
[im 6/46]
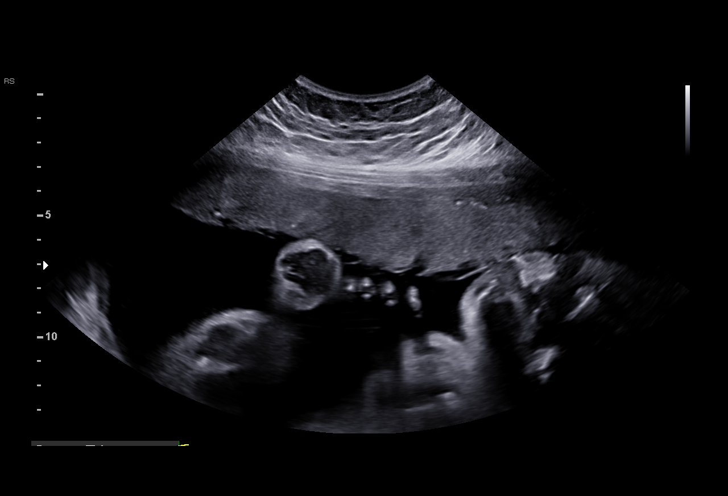
[im 9/46]
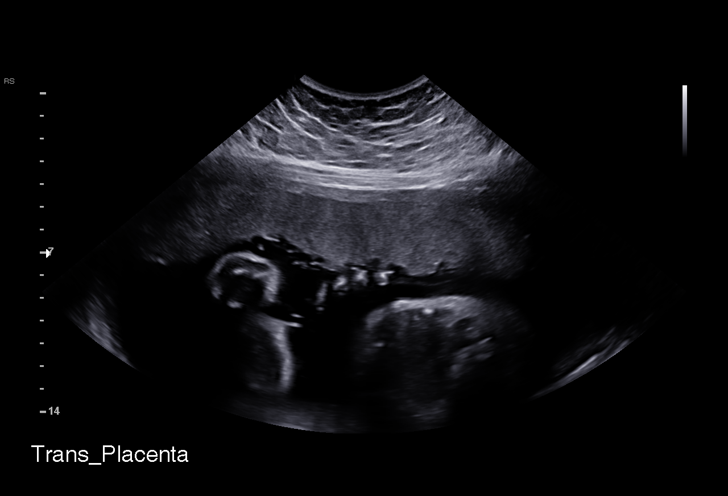
[im 12/46]
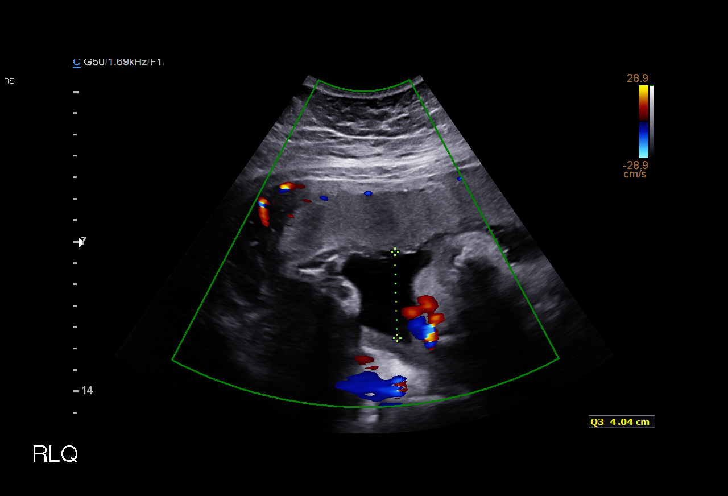
[im 16/46]
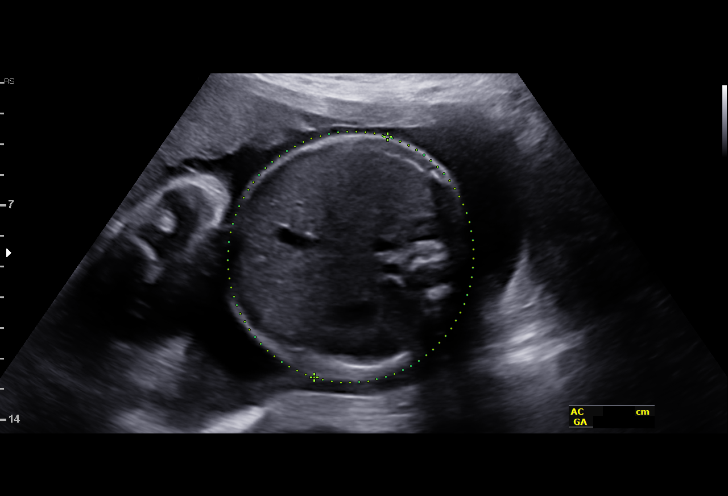
[im 19/46]
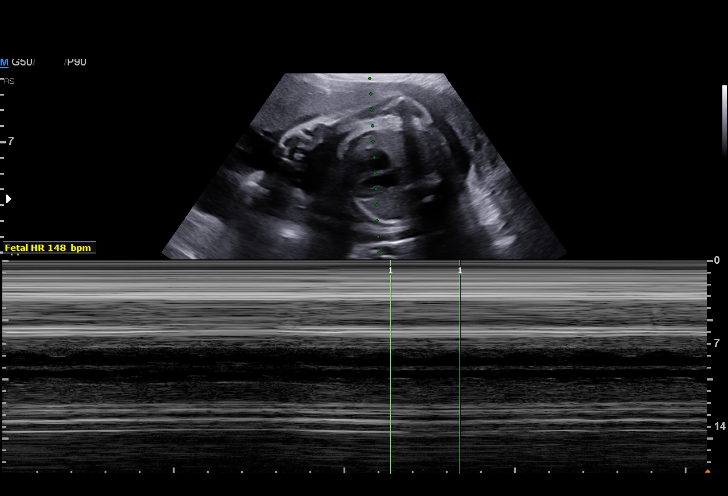
[im 22/46]
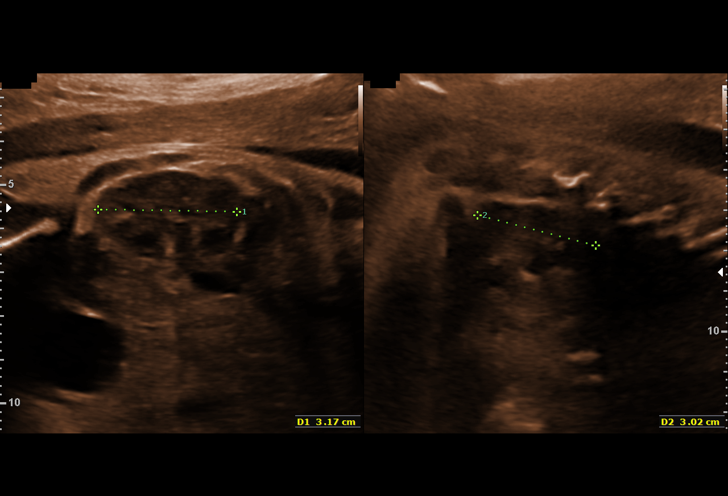
[im 26/46]
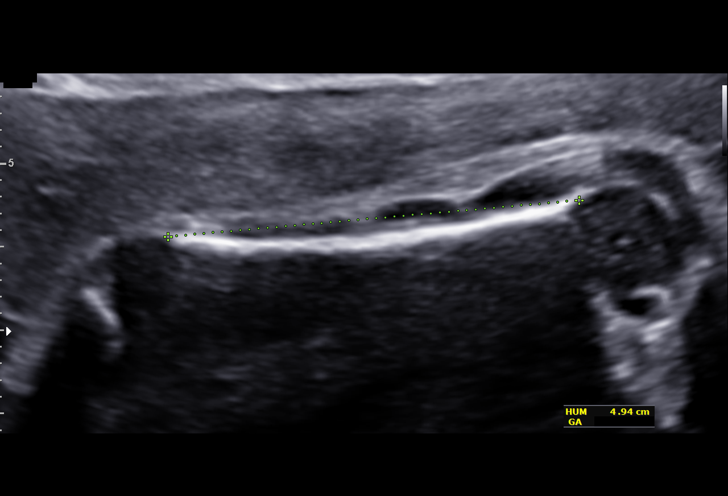
[im 29/46]
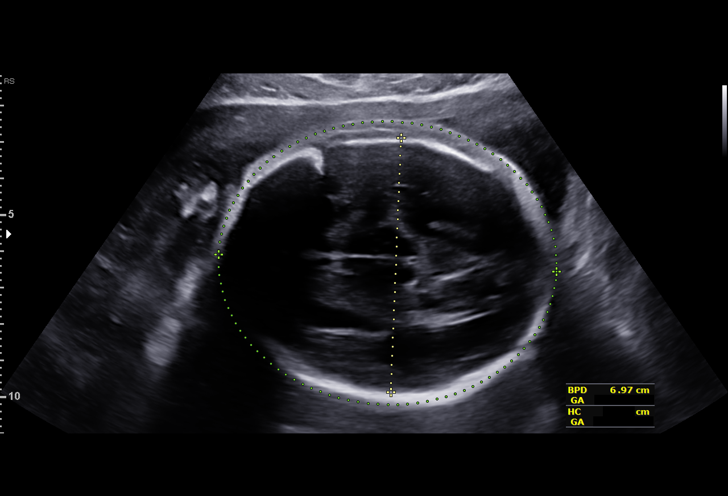
[im 32/46]
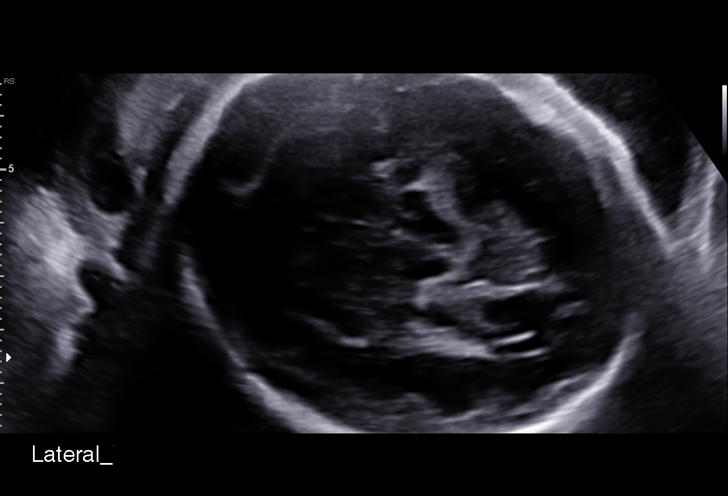
[im 36/46]
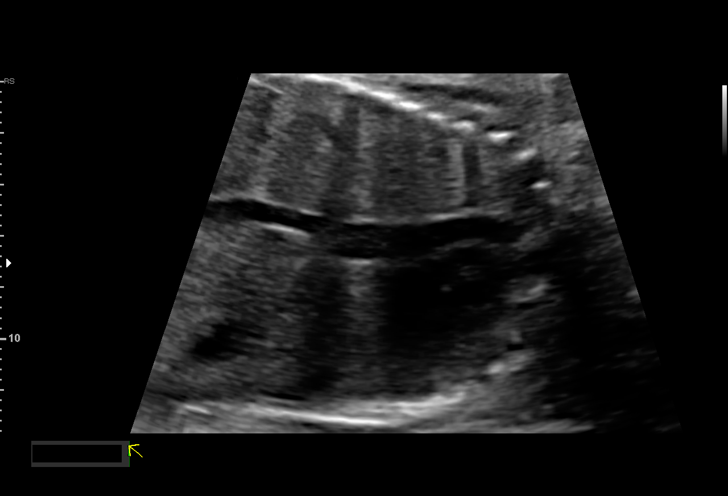
[im 39/46]
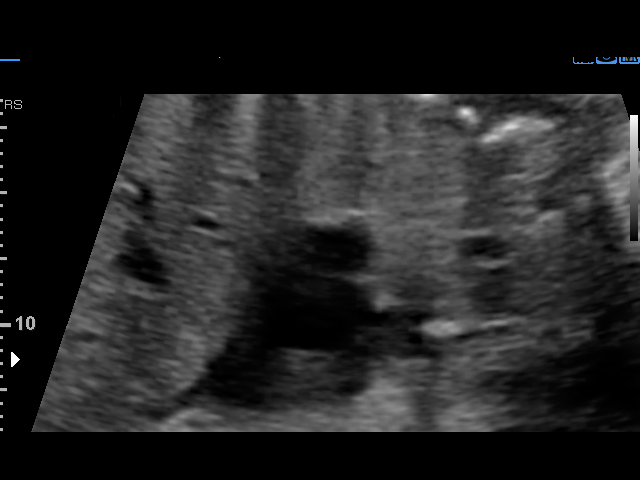
[im 42/46]
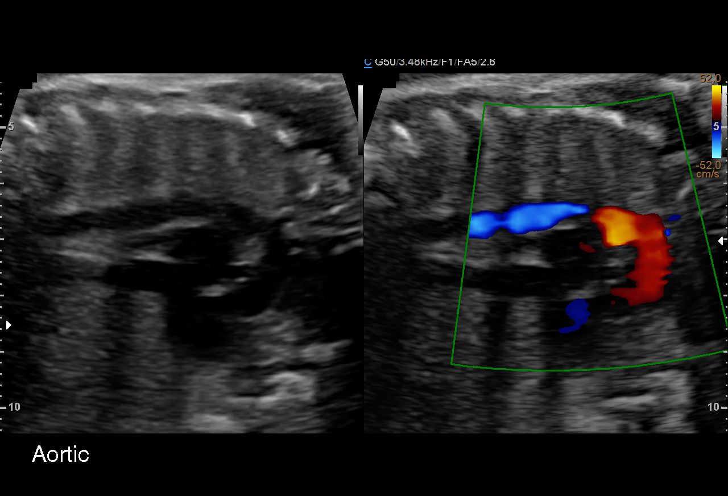
[im 46/46]
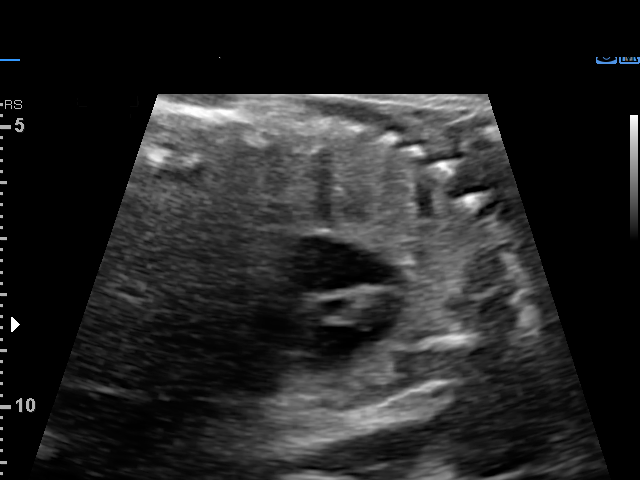

[14 of 28 positions shown; findings below may reference images not displayed]

1  MOEISIS TAGARTA              [id number]      [id number]     [id number]
Indications

29 weeks gestation of pregnancy
Encounter for other antenatal screening
follow-up
Poor obstetric history: Previous
preeclampsia / eclampsia/gestational HTN
Obesity complicating pregnancy, third
trimester
Pre-existing diabetes, type 2, in pregnancy,
third trimester (insulin)
OB History

Blood Type:            Height:         Weight (lb):  217.8     BMI:
Gravidity:    2         Term:   1
Living:       1
Fetal Evaluation

Num Of Fetuses:     1
Fetal Heart         148
Rate(bpm):
Cardiac Activity:   Observed
Presentation:       Cephalic
Placenta:           Anterior, above cervical os
P. Cord Insertion:  Previously Visualized

Amniotic Fluid
AFI FV:      Subjectively within normal limits

AFI Sum(cm)     %Tile       Largest Pocket(cm)
23.31           95
RUQ(cm)       RLQ(cm)       LUQ(cm)        LLQ(cm)
6.92
Biometry

BPD:      71.5  mm     G. Age:  28w 5d         24  %    CI:        72.82   %    70 - 86
FL/HC:      21.5   %    19.6 -
HC:      266.4  mm     G. Age:  29w 0d         16  %    HC/AC:      1.04        0.99 -
AC:      256.5  mm     G. Age:  29w 6d         64  %    FL/BPD:     80.3   %    71 - 87
FL:       57.4  mm     G. Age:  30w 0d         62  %    FL/AC:      22.4   %    20 - 24
HUM:      49.2  mm     G. Age:  29w 0d         39  %

Est. FW:    2007  gm      3 lb 3 oz     63  %
Gestational Age

LMP:           30w 0d        Date:  05/04/17                 EDD:   02/08/18
U/S Today:     29w 3d                                        EDD:   02/12/18
Best:          29w 1d     Det. By:  Early Ultrasound         EDD:   02/14/18
(06/27/17)
Anatomy

Cranium:               Appears normal         Aortic Arch:            Appears normal
Cavum:                 Previously seen        Ductal Arch:            See echo report
Ventricles:            Appears normal         Diaphragm:              Previously seen
Choroid Plexus:        Previously seen        Stomach:                Appears normal, left
sided
Cerebellum:            Previously seen        Abdomen:                Appears normal
Posterior Fossa:       Previously seen        Abdominal Wall:         Previously seen
Nuchal Fold:           Not applicable (>20    Cord Vessels:           Appears normal (3
wks GA)                                        vessel cord)
Face:                  Orbits previously      Kidneys:                Appear normal
seen
Lips:                  Previously seen        Bladder:                Appears normal
Thoracic:              Appears normal         Spine:                  Previously seen
Heart:                 See echo report        Upper Extremities:      Previously seen
RVOT:                  See echo report        Lower Extremities:      Previously seen
LVOT:                  Appears normal

Other:  Technically difficult due to maternal habitus and fetal position. Fetus
appears to be a female.
Cervix Uterus Adnexa

Cervix
Not visualized (advanced GA >19wks)

Uterus
No abnormality visualized.

Left Ovary
Not visualized.

Right Ovary
Not visualized.

Adnexa:       No abnormality visualized. No adnexal mass
visualized.
Comments

Fetal echo 10/12/17
Impression

Single living intrauterine pregnancy at 29w 1d.
Placenta Anterior, above cervical os.
Appropriate fetal growth.
Normal amniotic fluid volume.
The profile is not well visualized.
The fetal anatomic survey is otherwise complete.
Normal fetal anatomy.
No fetal anomalies or soft markers of aneuploidy seen.
Recommendations

Continue serial ultrasounds for fetal growth.
Antenatal testing to begin at 32 weeks.

## 2018-12-13 IMAGING — US US MFM FETAL BPP W/O NON-STRESS
1 series · 14 of 28 positions shown · non-contrast
Comparison: none

[Series 1: us mfm fetal bpp w/o non-stress · 51 acquisitions, 14 frames shown]
[im 2/51]
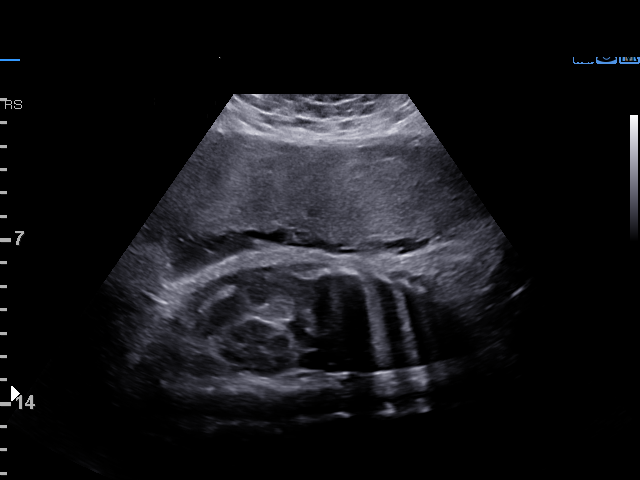
[im 6/51]
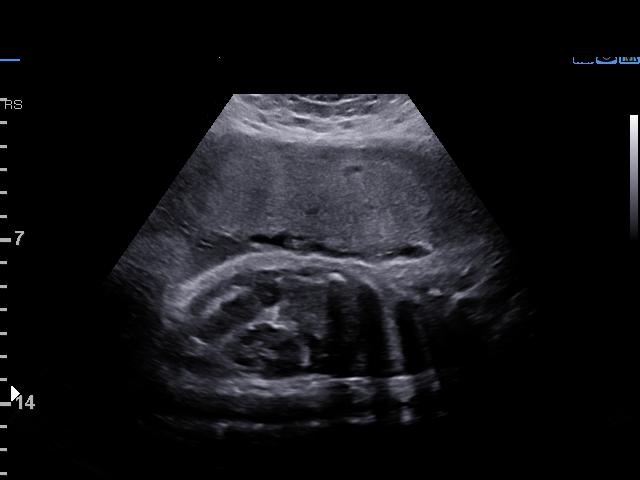
[im 10/51]
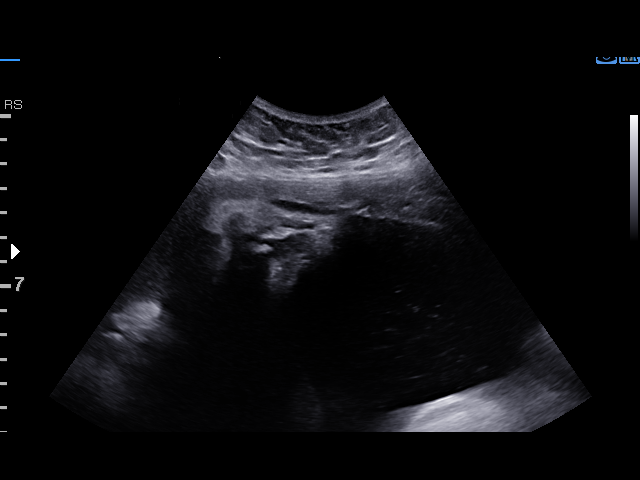
[im 13/51]
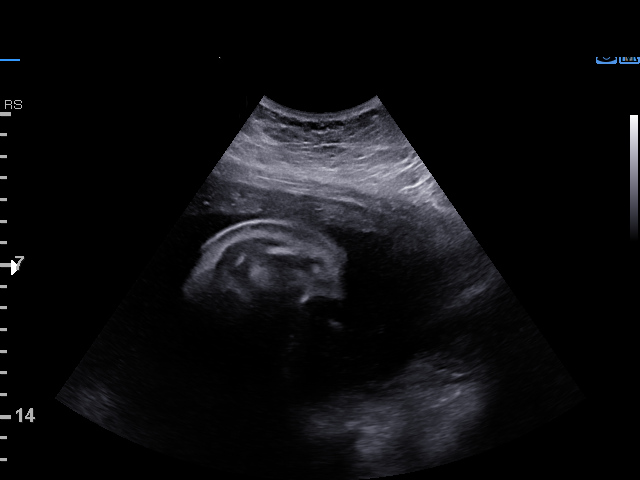
[im 17/51]
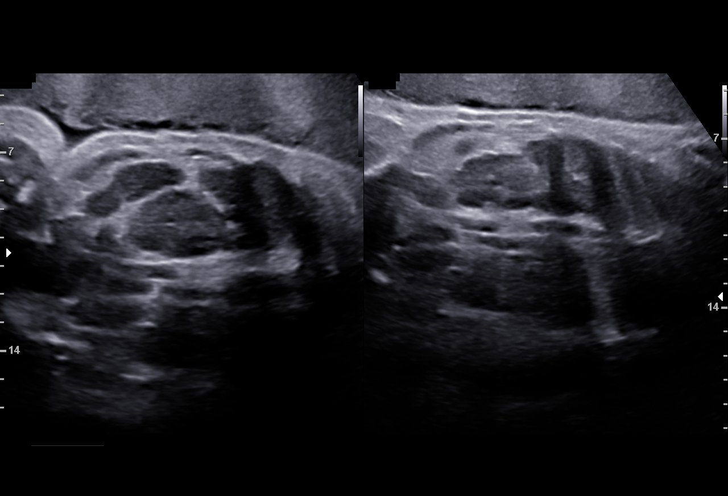
[im 21/51]
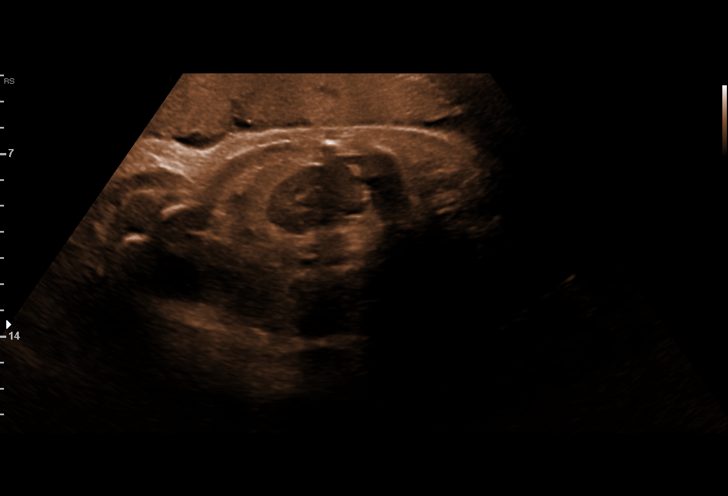
[im 25/51]
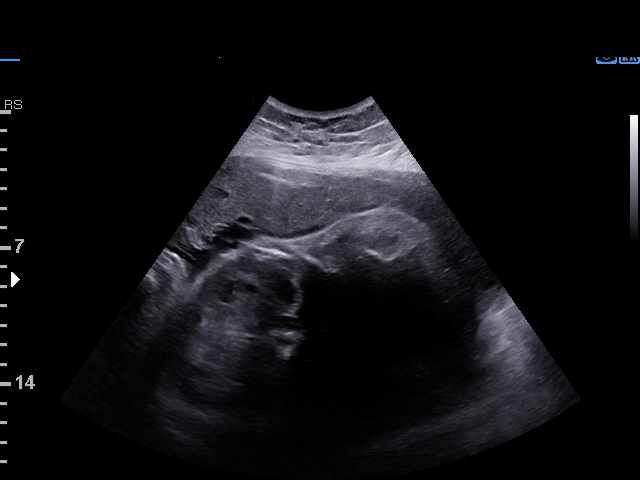
[im 28/51]
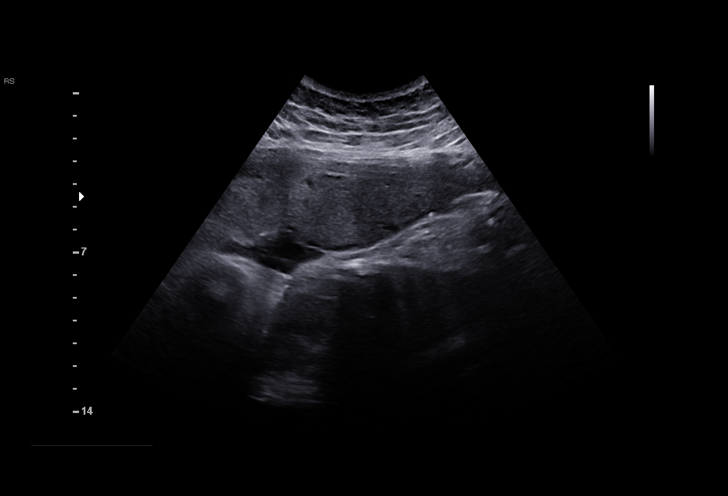
[im 32/51]
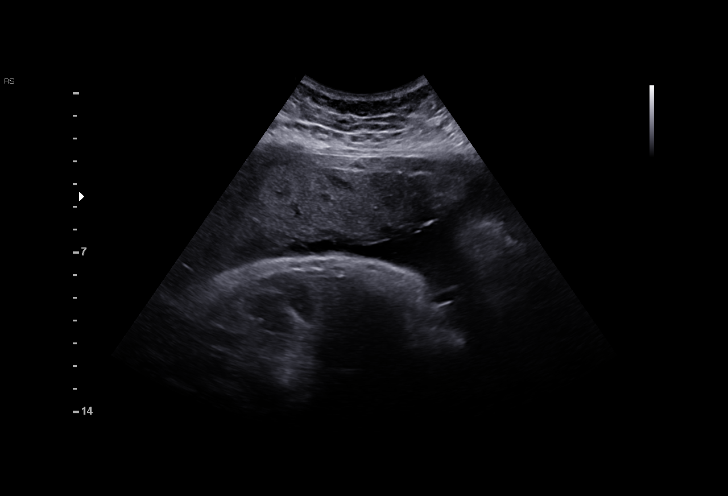
[im 36/51]
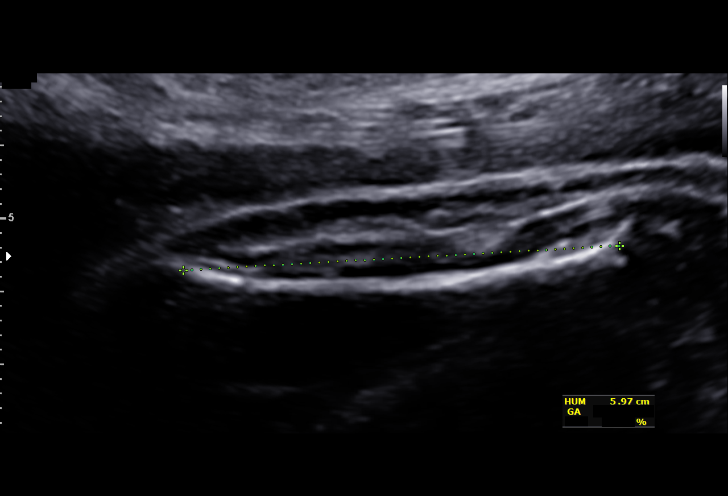
[im 39/51]
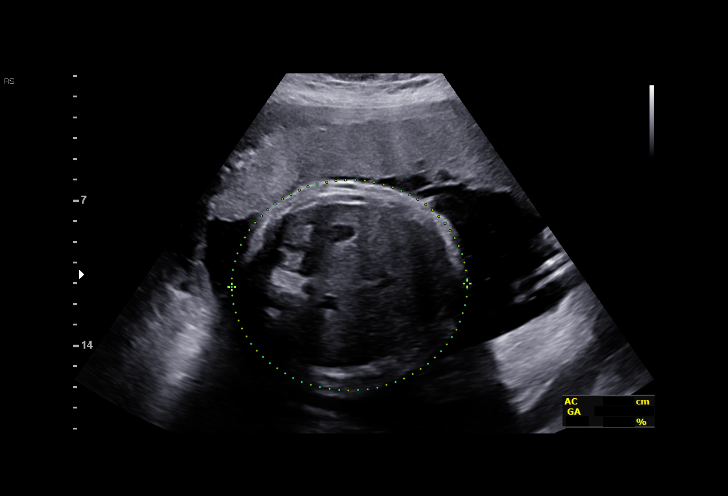
[im 43/51]
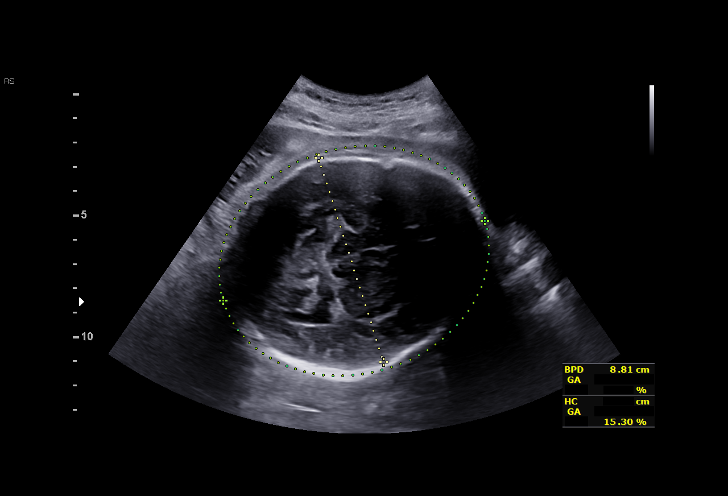
[im 47/51]
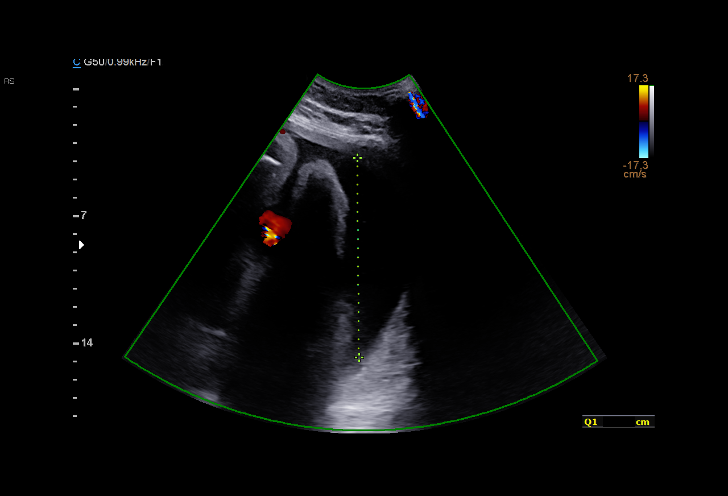
[im 51/51]
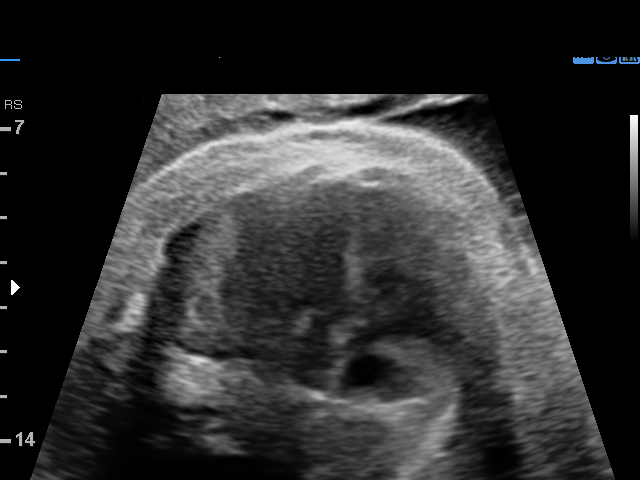

[14 of 28 positions shown; findings below may reference images not displayed]

1  FAZERO WESMARINAKAL              [id number]      [id number]     [id number]
2  FAZERO WESMARINAKAL              [id number]      [id number]     [id number]
Indications

37 weeks gestation of pregnancy
Encounter for other antenatal screening
follow-up
Poor obstetric history: Previous
preeclampsia / eclampsia/gestational HTN
Obesity complicating pregnancy, third
trimester
Pre-existing diabetes, type 2, in pregnancy,
third trimester (insulin)
OB History

Blood Type:            Height:         Weight (lb):  217.8     BMI:
Gravidity:    2         Term:   1
Living:       1
Fetal Evaluation

Num Of Fetuses:     1
Fetal Heart         162
Rate(bpm):
Cardiac Activity:   Observed
Presentation:       Cephalic
Placenta:           Anterior, above cervical os
P. Cord Insertion:  Not well visualized

Amniotic Fluid
AFI FV:      Polyhydramnios - mild

AFI Sum(cm)     %Tile       Largest Pocket(cm)
30.02           > 97
RUQ(cm)       RLQ(cm)       LUQ(cm)        LLQ(cm)
10.98
Biophysical Evaluation

Amniotic F.V:   Pocket => 2 cm two         F. Tone:        Observed
planes
F. Movement:    Observed                   Score:          [DATE]
F. Breathing:   Observed
Biometry

BPD:      87.9  mm     G. Age:  35w 4d         22  %    CI:        73.76   %    70 - 86
FL/HC:      21.3   %    20.8 -
HC:      325.1  mm     G. Age:  36w 6d         20  %    HC/AC:      0.96        0.92 -
AC:      339.8  mm     G. Age:  37w 6d         81  %    FL/BPD:     79.0   %    71 - 87
FL:       69.4  mm     G. Age:  35w 4d         15  %    FL/AC:      20.4   %    20 - 24
HUM:      59.5  mm     G. Age:  34w 4d         20  %

Est. FW:    8545  gm    6 lb 12 oz      66  %
Gestational Age

LMP:           38w 0d        Date:  05/04/17                 EDD:   02/08/18
U/S Today:     36w 3d                                        EDD:   02/19/18
Best:          37w 1d     Det. By:  Early Ultrasound         EDD:   02/14/18
(06/27/17)
Anatomy

Cranium:               Previously seen        Aortic Arch:            Previously seen
Cavum:                 Previously seen        Ductal Arch:            See echo report
Ventricles:            Previously seen        Diaphragm:              Previously seen
Choroid Plexus:        Previously seen        Stomach:                Appears normal, left
sided
Cerebellum:            Previously seen        Abdomen:                Previously seen
Posterior Fossa:       Previously seen        Abdominal Wall:         Previously seen
Nuchal Fold:           Not applicable (>20    Cord Vessels:           Previously seen
wks GA)
Face:                  Orbits and profile     Kidneys:                Appear normal
previously seen
Lips:                  Previously seen        Bladder:                Appears normal
Thoracic:              Appears normal         Spine:                  Previously seen
Heart:                 See echo report        Upper Extremities:      Previously seen
RVOT:                  See echo report        Lower Extremities:      Previously seen
LVOT:                  Previously seen

Other:  Technically difficult due to maternal habitus and fetal position. Fetus
appears to be a female previously seen.
Cervix Uterus Adnexa

Cervix
Not visualized (advanced GA >73wks)

Uterus
No abnormality visualized.

Left Ovary
Not visualized.
Right Ovary
Not visualized.

Adnexa:       No abnormality visualized. No adnexal mass
visualized.
Impression

SIUP at 37+1 weeks
Cephalic presentation
Normal interval anatomy; anatomic survey complete
Mild polyhydramnios
Appropriate interval growth with EFW at the 66th %tile
BPP [DATE]
Recommendations

Continue antenatal testing.
Recommend delivery by 39 weeks if maternal and fetal status
remain reassuring.

## 2019-12-27 ENCOUNTER — Encounter: Payer: Self-pay | Admitting: Emergency Medicine

## 2019-12-27 ENCOUNTER — Emergency Department
Admission: EM | Admit: 2019-12-27 | Discharge: 2019-12-27 | Disposition: A | Discharge status: Home / Self Care | Payer: BC Managed Care – PPO | Attending: Emergency Medicine | Admitting: Emergency Medicine

## 2019-12-27 ENCOUNTER — Other Ambulatory Visit: Payer: Self-pay

## 2019-12-27 DIAGNOSIS — Y929 Unspecified place or not applicable: Secondary | ICD-10-CM | POA: Insufficient documentation

## 2019-12-27 DIAGNOSIS — Z79899 Other long term (current) drug therapy: Secondary | ICD-10-CM | POA: Insufficient documentation

## 2019-12-27 DIAGNOSIS — Y9389 Activity, other specified: Secondary | ICD-10-CM | POA: Diagnosis not present

## 2019-12-27 DIAGNOSIS — E119 Type 2 diabetes mellitus without complications: Secondary | ICD-10-CM | POA: Diagnosis not present

## 2019-12-27 DIAGNOSIS — W458XXA Other foreign body or object entering through skin, initial encounter: Secondary | ICD-10-CM | POA: Diagnosis not present

## 2019-12-27 DIAGNOSIS — S61211A Laceration without foreign body of left index finger without damage to nail, initial encounter: Secondary | ICD-10-CM | POA: Insufficient documentation

## 2019-12-27 DIAGNOSIS — Y999 Unspecified external cause status: Secondary | ICD-10-CM | POA: Diagnosis not present

## 2019-12-27 MED ORDER — CEPHALEXIN 500 MG PO CAPS: 500.0000 mg | ORAL_CAPSULE | Freq: Three times a day (TID) | ORAL | 0 refills | Status: AC

## 2019-12-27 NOTE — ED Notes (Signed)
Pt states she cut the tip of her left pointer finger on 2/14 while opening a can of food, states she cant get it to stop bleeding pt has 2 bandaides on arrival when removed no active bleeding noted. 1/4in lac to the tip of the finger noted.

## 2019-12-27 NOTE — ED Triage Notes (Signed)
Pt reports cut her left hand pointer finger opening a can on valentines day and it continues to bleed intermittently and is swollen and painful.

## 2019-12-27 NOTE — Discharge Instructions (Addendum)
Keep the area as dry as possible.  Take the antibiotic as prescribed.  Wear the finger splint to protect the finger.  Return emergency department worsening.  If you see any sign of infection which is redness pus swelling or increased pain please follow-up with your regular doctor.

## 2019-12-27 NOTE — ED Provider Notes (Signed)
[facility] Emergency Department Provider Note  ____________________________________________   First MD Initiated Contact with Patient 12/27/19 1120     (approximate)  I have reviewed the triage vital signs and the nursing notes.   HISTORY  Chief Complaint Laceration    HPI Jennifer Montes is a 29 y.o. female presents emergency department complaint of laceration to the tip of the left index finger on 12/24/2019.  Patient states she was opening a can and sliced the finger.  She states that since continue to bleed on and off and is very tender.  Tdap is up-to-date.  No fever or chills.  Remainder review of systems negative    Past Medical History:  Diagnosis Date  . Diabetes mellitus without complication ([place])    type2  . Hx of preeclampsia, prior pregnancy, currently pregnant     Patient Active Problem List   Diagnosis Date Noted  . SVD (spontaneous vaginal delivery) 01/29/2018  . Variable fetal heart rate decelerations, antepartum 01/28/2018  . Pregnancy complicated by noncompliance, antepartum 12/28/2017  . Hypertension during pregnancy 08/10/2017  . Atypical squamous cell changes of undetermined significance (ASCUS) on cervical cytology with positive high risk human papilloma virus (HPV) 08/10/2017  . Hx of preeclampsia, prior pregnancy, currently pregnant 08/09/2017  . Supervision of high risk pregnancy, antepartum 08/04/2017  . Obesity in pregnancy 08/04/2017  . Type 2 diabetes mellitus ([place]) 08/04/2017  . Type 2 diabetes mellitus in pregnancy 06/29/2017  . Depression 12/18/2013    Past Surgical History:  Procedure Laterality Date  . NO PAST SURGERIES      Prior to Admission medications   Medication Sig Start Date End Date Taking? Authorizing Provider  amLODipine (NORVASC) 5 MG tablet Take 1 tablet (5 mg total) by mouth daily. 03/03/18   Lavonia Drafts, MD  cephALEXin (KEFLEX) 500 MG capsule Take 1 capsule (500 mg total) by  mouth 3 (three) times daily. 12/27/19   Jonay Hitchcock, Linden Dolin, PA-C  Prenatal Vit-Fe Fumarate-FA (PRENATAL MULTIVITAMIN) TABS tablet Take 1 tablet by mouth daily at 12 noon.    [provider]  sertraline (ZOLOFT) 25 MG tablet Take 1 tablet (25 mg total) by mouth daily. 03/03/18   Lavonia Drafts, MD  sertraline (ZOLOFT) 50 MG tablet Take 1 tablet (50 mg total) by mouth daily. 03/03/18   Lavonia Drafts, MD  triamterene-hydrochlorothiazide (DYAZIDE) 37.5-25 MG capsule Take 1 each (1 capsule total) by mouth daily. Patient not taking: Reported on 03/03/2018 02/07/18 12/27/19  Truett Mainland, DO    Allergies Patient has no known allergies.  Family History  Problem Relation Age of Onset  . Diabetes Mother     Social History Social History   Tobacco Use  . Smoking status: Never Smoker  . Smokeless tobacco: Never Used  Substance Use Topics  . Alcohol use: No  . Drug use: No    Review of Systems  Constitutional: No fever/chills Eyes: No visual changes. ENT: No sore throat. Respiratory: Denies cough Cardiovascular: Denies chest pain Genitourinary: Negative for dysuria. Musculoskeletal: Negative for back pain.  Laceration to left index finger Skin: Negative for rash. Psychiatric: no mood changes,     ____________________________________________   PHYSICAL EXAM:  VITAL SIGNS: ED Triage Vitals  Enc Vitals Group     BP 12/27/19 1009 128/80     Pulse Rate 12/27/19 1009 95     Resp 12/27/19 1009 20     Temp 12/27/19 1009 98.6 F (37 C)     Temp Source 12/27/19 1009  Oral     SpO2 12/27/19 1009 99 %     Weight 12/27/19 1003 212 lb (96.2 kg)     Height 12/27/19 1003 5\' 4"  (1.626 m)     Head Circumference --      Peak Flow --      Pain Score 12/27/19 1003 7     Pain Loc --      Pain Edu? --      Excl. in GC? --     Constitutional: Alert and oriented. Well appearing and in no acute distress. Eyes: Conjunctivae are normal.  Head: Atraumatic. Nose: No  congestion/rhinnorhea. Mouth/Throat: Mucous membranes are moist.   Neck:  supple no lymphadenopathy noted Cardiovascular: Normal rate, regular rhythm. Respiratory: Normal respiratory effort.   GU: deferred Musculoskeletal: FROM all extremities, warm and well perfused, small laceration noted at the tip of the left index finger, area appears to be old, no active bleeding is noted Neurologic:  Normal speech and language.  Skin:  Skin is warm, dry and intact. No rash noted. Psychiatric: Mood and affect are normal. Speech and behavior are normal.  ____________________________________________   LABS (all labs ordered are listed, but only abnormal results are displayed)  Labs Reviewed - No data to display ____________________________________________   ____________________________________________  RADIOLOGY    ____________________________________________   PROCEDURES  Procedure(s) performed: No  Procedures    ____________________________________________   INITIAL IMPRESSION / ASSESSMENT AND PLAN / ED COURSE  Pertinent labs & imaging results that were available during my care of the patient were reviewed by me and considered in my medical decision making (see chart for details).   Patient presents emergency department with laceration to the tip of the left index finger.  Physical exam shows same.  Explained findings to the patient.  Explained to her that the area is too old to suture.  I will be glad to place her on antibiotic and put a dressing and splint on it to protect it.  She is to follow-up with her regular doctor for any sign of continued infection.  Return emergency department worsening.  She discharged stable condition.  Tdap was already updated.    Jennifer Montes was evaluated in Emergency Department on 12/27/2019 for the symptoms described in the history of present illness. She was evaluated in the context of the global COVID-19 pandemic, which necessitated  consideration that the patient might be at risk for infection with the SARS-CoV-2 virus that causes COVID-19. Institutional protocols and algorithms that pertain to the evaluation of patients at risk for COVID-19 are in a state of rapid change based on information released by regulatory bodies including the CDC and federal and state organizations. These policies and algorithms were followed during the patient's care in the ED.   As part of my medical decision making, I reviewed the following data within the electronic MEDICAL RECORD NUMBER Nursing notes reviewed and incorporated, Old chart reviewed, Notes from prior ED visits and [place] Controlled Substance Database  ____________________________________________   FINAL CLINICAL IMPRESSION(S) / ED DIAGNOSES  Final diagnoses:  Laceration of left index finger without foreign body without damage to nail, initial encounter      NEW MEDICATIONS STARTED DURING THIS VISIT:  Discharge Medication List as of 12/27/2019 11:44 AM    START taking these medications   Details  cephALEXin (KEFLEX) 500 MG capsule Take 1 capsule (500 mg total) by mouth 3 (three) times daily., Starting Wed 12/27/2019, Normal         Note:  This document was prepared using Dragon voice recognition software and may include unintentional dictation errors.    Faythe Ghee, PA-C 12/27/19 1317    Shaune Pollack, MD 12/28/19 [phone]

## 2020-07-23 ENCOUNTER — Emergency Department: Payer: BC Managed Care – PPO

## 2020-07-23 ENCOUNTER — Emergency Department
Admission: EM | Admit: 2020-07-23 | Discharge: 2020-07-23 | Disposition: A | Discharge status: Home / Self Care | Payer: BC Managed Care – PPO | Attending: Emergency Medicine | Admitting: Emergency Medicine

## 2020-07-23 ENCOUNTER — Other Ambulatory Visit: Payer: Self-pay

## 2020-07-23 DIAGNOSIS — R42 Dizziness and giddiness: Secondary | ICD-10-CM | POA: Insufficient documentation

## 2020-07-23 DIAGNOSIS — B349 Viral infection, unspecified: Secondary | ICD-10-CM | POA: Diagnosis not present

## 2020-07-23 DIAGNOSIS — R112 Nausea with vomiting, unspecified: Secondary | ICD-10-CM | POA: Insufficient documentation

## 2020-07-23 DIAGNOSIS — Z20822 Contact with and (suspected) exposure to covid-19: Secondary | ICD-10-CM | POA: Diagnosis not present

## 2020-07-23 DIAGNOSIS — R1110 Vomiting, unspecified: Secondary | ICD-10-CM | POA: Diagnosis present

## 2020-07-23 DIAGNOSIS — E119 Type 2 diabetes mellitus without complications: Secondary | ICD-10-CM | POA: Diagnosis not present

## 2020-07-23 LAB — RESPIRATORY PANEL BY RT PCR (FLU A&B, COVID)
Influenza A by PCR: NEGATIVE
Influenza B by PCR: NEGATIVE
SARS Coronavirus 2 by RT PCR: NEGATIVE

## 2020-07-23 LAB — GROUP A STREP BY PCR: Group A Strep by PCR: NOT DETECTED

## 2020-07-23 MED ORDER — IBUPROFEN 600 MG PO TABS: 600.0000 mg | ORAL_TABLET | Freq: Three times a day (TID) | ORAL | 0 refills | Status: DC | PRN

## 2020-07-23 MED ORDER — PSEUDOEPH-BROMPHEN-DM 30-2-10 MG/5ML PO SYRP: 5.0000 mL | ORAL_SOLUTION | Freq: Four times a day (QID) | ORAL | 0 refills | Status: DC | PRN

## 2020-07-23 MED ORDER — ONDANSETRON 8 MG PO TBDP
8.0000 mg | ORAL_TABLET | Freq: Once | ORAL | Status: AC
  Administered 2020-07-23: 12:00:00 8 mg via ORAL
  Filled 2020-07-23: qty 1

## 2020-07-23 MED ORDER — KETOROLAC TROMETHAMINE 60 MG/2ML IM SOLN
60.0000 mg | Freq: Once | INTRAMUSCULAR | Status: AC
  Administered 2020-07-23: 12:00:00 60 mg via INTRAMUSCULAR
  Filled 2020-07-23: qty 2

## 2020-07-23 MED ORDER — ONDANSETRON HCL 8 MG PO TABS: 8.0000 mg | ORAL_TABLET | Freq: Three times a day (TID) | ORAL | 0 refills | Status: DC | PRN

## 2020-07-23 MED ORDER — ONDANSETRON HCL 8 MG PO TABS: 8.0000 mg | ORAL_TABLET | Freq: Three times a day (TID) | ORAL | 0 refills | Status: AC | PRN

## 2020-07-23 MED ORDER — IBUPROFEN 600 MG PO TABS: 600.0000 mg | ORAL_TABLET | Freq: Three times a day (TID) | ORAL | 0 refills | Status: AC | PRN

## 2020-07-23 NOTE — ED Provider Notes (Signed)
[facility] Emergency Department Provider Note   ____________________________________________   First MD Initiated Contact with Patient 07/23/20 1118     (approximate)  I have reviewed the triage vital signs and the nursing notes.   HISTORY  Chief Complaint Emesis    HPI Jennifer Montes is a 29 y.o. female patient complain of productive cough with chest congestion for 1 week.  Patient also states nausea and vomiting which began this morning.  Patient states she got dizzy this morning with mild dyspnea, body aches, and sore throat.  Patient has completed COVID-19 vaccine.         Past Medical History:  Diagnosis Date  . Diabetes mellitus without complication (HCC)    type2  . Hx of preeclampsia, prior pregnancy, currently pregnant     Patient Active Problem List   Diagnosis Date Noted  . SVD (spontaneous vaginal delivery) 01/29/2018  . Variable fetal heart rate decelerations, antepartum 01/28/2018  . Pregnancy complicated by noncompliance, antepartum 12/28/2017  . Hypertension during pregnancy 08/10/2017  . Atypical squamous cell changes of undetermined significance (ASCUS) on cervical cytology with positive high risk human papilloma virus (HPV) 08/10/2017  . Hx of preeclampsia, prior pregnancy, currently pregnant 08/09/2017  . Supervision of high risk pregnancy, antepartum 08/04/2017  . Obesity in pregnancy 08/04/2017  . Type 2 diabetes mellitus (HCC) 08/04/2017  . Type 2 diabetes mellitus in pregnancy 06/29/2017  . Depression 12/18/2013    Past Surgical History:  Procedure Laterality Date  . NO PAST SURGERIES      Prior to Admission medications   Medication Sig Start Date End Date Taking? Authorizing Provider  amLODipine (NORVASC) 5 MG tablet Take 1 tablet (5 mg total) by mouth daily. 03/03/18   Willodean Rosenthal, MD  brompheniramine-pseudoephedrine-DM 30-2-10 MG/5ML syrup Take 5 mLs by mouth 4 (four) times daily as needed.  07/23/20   Joni Reining, PA-C  cephALEXin (KEFLEX) 500 MG capsule Take 1 capsule (500 mg total) by mouth 3 (three) times daily. 12/27/19   Fisher, Roselyn Bering, PA-C  ibuprofen (ADVIL) 600 MG tablet Take 1 tablet (600 mg total) by mouth every 8 (eight) hours as needed. 07/23/20   Joni Reining, PA-C  ondansetron (ZOFRAN) 8 MG tablet Take 1 tablet (8 mg total) by mouth every 8 (eight) hours as needed for nausea or vomiting. 07/23/20   Joni Reining, PA-C  Prenatal Vit-Fe Fumarate-FA (PRENATAL MULTIVITAMIN) TABS tablet Take 1 tablet by mouth daily at 12 noon.    [provider]  sertraline (ZOLOFT) 25 MG tablet Take 1 tablet (25 mg total) by mouth daily. 03/03/18   Willodean Rosenthal, MD  sertraline (ZOLOFT) 50 MG tablet Take 1 tablet (50 mg total) by mouth daily. 03/03/18   Willodean Rosenthal, MD  triamterene-hydrochlorothiazide (DYAZIDE) 37.5-25 MG capsule Take 1 each (1 capsule total) by mouth daily. Patient not taking: Reported on 03/03/2018 02/07/18 12/27/19  Levie Heritage, DO    Allergies Patient has no known allergies.  Family History  Problem Relation Age of Onset  . Diabetes Mother     Social History Social History   Tobacco Use  . Smoking status: Never Smoker  . Smokeless tobacco: Never Used  Substance Use Topics  . Alcohol use: No  . Drug use: No    Review of Systems Constitutional: No fever/chills.  Body aches. Eyes: No visual changes. ENT: Sore throat. Cardiovascular: Denies chest pain. Respiratory: Denies shortness of breath. Gastrointestinal: No abdominal pain.  No nausea, no vomiting.  No diarrhea.  No constipation. Genitourinary: Negative for dysuria. Musculoskeletal: Negative for back pain. Skin: Negative for rash. Neurological: Negative for headaches, focal weakness or numbness. Psychiatric:  Depression Endocrine:  Diabetes ____________________________________________   PHYSICAL EXAM:  VITAL SIGNS: ED Triage Vitals [07/23/20 1008]  Enc  Vitals Group     BP 132/82     Pulse Rate 80     Resp 18     Temp 98.5 F (36.9 C)     Temp Source Oral     SpO2 100 %     Weight 190 lb (86.2 kg)     Height 5\' 4"  (1.626 m)     Head Circumference      Peak Flow      Pain Score 8     Pain Loc      Pain Edu?      Excl. in GC?    Constitutional: Alert and oriented. Well appearing and in no acute distress. Eyes: Conjunctivae are normal. PERRL. EOMI. Head: Atraumatic. Nose: No congestion/rhinnorhea. Mouth/Throat: Mucous membranes are moist.  Oropharynx erythematous. Neck: No stridor.  Hematological/Lymphatic/Immunilogical: No cervical lymphadenopathy. Cardiovascular: Normal rate, regular rhythm. Grossly normal heart sounds.  Good peripheral circulation. Respiratory: Normal respiratory effort.  No retractions. Lungs CTAB. Gastrointestinal: Soft and nontender. No distention. No abdominal bruits. No CVA tenderness. Genitourinary: Deferred Musculoskeletal: No lower extremity tenderness nor edema.  No joint effusions. Neurologic:  Normal speech and language. No gross focal neurologic deficits are appreciated. No gait instability. Skin:  Skin is warm, dry and intact. No rash noted. Psychiatric: Mood and affect are normal. Speech and behavior are normal.  ____________________________________________   LABS (all labs ordered are listed, but only abnormal results are displayed)  Labs Reviewed  GROUP A STREP BY PCR  RESPIRATORY PANEL BY RT PCR (FLU A&B, COVID)   ____________________________________________  EKG   ____________________________________________  RADIOLOGY  ED MD interpretation:    Official radiology report(s): DG Chest Portable 1 View  Result Date: 07/23/2020 CLINICAL DATA:  Productive cough for 1 week, congestion and vomiting since last week worsened yesterday, now with dizziness, shortness of breath, body aches and sore throat EXAM: PORTABLE CHEST 1 VIEW COMPARISON:  Portable exam 1153 hours compared to  01/12/2016 FINDINGS: Normal heart size, mediastinal contours, and pulmonary vascularity. Lungs clear. No infiltrate, pleural effusion or pneumothorax. Osseous structures unremarkable. IMPRESSION: No acute abnormalities. Electronically Signed   By: 03/13/2016 M.D.   On: 07/23/2020 12:08    ____________________________________________   PROCEDURES  Procedure(s) performed (including Critical Care):  Procedures   ____________________________________________   INITIAL IMPRESSION / ASSESSMENT AND PLAN / ED COURSE  As part of my medical decision making, I reviewed the following data within the electronic MEDICAL RECORD NUMBER      Patient presents with body aches, dizziness, mild dyspnea and sore throat.  Patient has taken the COVID-19 vaccine.  Discussed negative strep and x-ray findings with patient.  Patient complaining physical exam is consistent with viral illness.  Patient given discharge care instruction advised self quarantine pending results of COVID-19 test.  Take medication as directed.  Return to ED if condition worsen.         ____________________________________________   FINAL CLINICAL IMPRESSION(S) / ED DIAGNOSES  Final diagnoses:  Viral illness     ED Discharge Orders         Ordered    brompheniramine-pseudoephedrine-DM 30-2-10 MG/5ML syrup  4 times daily PRN        07/23/20 1236  ondansetron (ZOFRAN) 8 MG tablet  Every 8 hours PRN        07/23/20 1236    ibuprofen (ADVIL) 600 MG tablet  Every 8 hours PRN        07/23/20 1236          *Please note:  Jennifer Montes was evaluated in Emergency Department on 07/23/2020 for the symptoms described in the history of present illness. She was evaluated in the context of the global COVID-19 pandemic, which necessitated consideration that the patient might be at risk for infection with the SARS-CoV-2 virus that causes COVID-19. Institutional protocols and algorithms that pertain to the evaluation of patients at risk  for COVID-19 are in a state of rapid change based on information released by regulatory bodies including the CDC and federal and state organizations. These policies and algorithms were followed during the patient's care in the ED.  Some ED evaluations and interventions may be delayed as a result of limited staffing during and the pandemic.*   Note:  This document was prepared using Dragon voice recognition software and may include unintentional dictation errors.    Joni Reining, PA-C 07/23/20 1239    Merwyn Katos, MD 07/23/20 [phone]

## 2020-07-23 NOTE — ED Triage Notes (Signed)
Pt here with congestion and vomiting since last week. Pt states that it got worse yesterday with dizziness, SOB, body aches, and a sore throat. Pt states that she saw blood while sneezing and coughing and also has a toothache. Pt states that she just feels bad.

## 2020-07-23 NOTE — Discharge Instructions (Signed)
Follow discharge care instruction take medication as directed.  Advised self quarantine pending results of COVID-19 test. 

## 2020-11-14 ENCOUNTER — Emergency Department
Admission: EM | Admit: 2020-11-14 | Discharge: 2020-11-14 | Disposition: A | Discharge status: Home / Self Care | Payer: BLUE CROSS/BLUE SHIELD | Attending: Family Medicine | Admitting: Family Medicine

## 2020-11-14 ENCOUNTER — Other Ambulatory Visit: Payer: Self-pay

## 2020-11-14 DIAGNOSIS — B349 Viral infection, unspecified: Secondary | ICD-10-CM | POA: Insufficient documentation

## 2020-11-14 DIAGNOSIS — E119 Type 2 diabetes mellitus without complications: Secondary | ICD-10-CM | POA: Insufficient documentation

## 2020-11-14 DIAGNOSIS — U071 COVID-19: Secondary | ICD-10-CM | POA: Insufficient documentation

## 2020-11-14 LAB — SARS CORONAVIRUS 2 (TAT 6-24 HRS): SARS Coronavirus 2: POSITIVE — AB

## 2020-11-14 MED ORDER — NAPROXEN 500 MG PO TABS: 500.0000 mg | ORAL_TABLET | Freq: Two times a day (BID) | ORAL | 0 refills | Status: AC

## 2020-11-14 MED ORDER — PSEUDOEPH-BROMPHEN-DM 30-2-10 MG/5ML PO SYRP: 10.0000 mL | ORAL_SOLUTION | Freq: Four times a day (QID) | ORAL | 0 refills | Status: AC | PRN

## 2020-11-14 NOTE — ED Provider Notes (Signed)
[facility] Emergency Department Provider Note  ____________________________________________  Time seen: Approximately 10:58 AM  I have reviewed the triage vital signs and the nursing notes.   HISTORY  Chief Complaint URI   HPI Jennifer Montes is a 30 y.o. female presents to the emergency department for treatment of cough, fever, chills, ear ache, and body aches that started yesterday. No alleviating measures prior to arrival.    Past Medical History:  Diagnosis Date  . Diabetes mellitus without complication (HCC)    type2  . Hx of preeclampsia, prior pregnancy, currently pregnant     Patient Active Problem List   Diagnosis Date Noted  . SVD (spontaneous vaginal delivery) 01/29/2018  . Variable fetal heart rate decelerations, antepartum 01/28/2018  . Pregnancy complicated by noncompliance, antepartum 12/28/2017  . Hypertension during pregnancy 08/10/2017  . Atypical squamous cell changes of undetermined significance (ASCUS) on cervical cytology with positive high risk human papilloma virus (HPV) 08/10/2017  . Hx of preeclampsia, prior pregnancy, currently pregnant 08/09/2017  . Supervision of high risk pregnancy, antepartum 08/04/2017  . Obesity in pregnancy 08/04/2017  . Type 2 diabetes mellitus (HCC) 08/04/2017  . Type 2 diabetes mellitus in pregnancy 06/29/2017  . Depression 12/18/2013    Past Surgical History:  Procedure Laterality Date  . NO PAST SURGERIES      Prior to Admission medications   Medication Sig Start Date End Date Taking? Authorizing Provider  brompheniramine-pseudoephedrine-DM 30-2-10 MG/5ML syrup Take 10 mLs by mouth 4 (four) times daily as needed. 11/14/20  Yes Helio Lack B, FNP  naproxen (NAPROSYN) 500 MG tablet Take 1 tablet (500 mg total) by mouth 2 (two) times daily with a meal. 11/14/20  Yes Eulala Newcombe B, FNP  amLODipine (NORVASC) 5 MG tablet Take 1 tablet (5 mg total) by mouth daily. 03/03/18   Willodean Rosenthal, MD  cephALEXin (KEFLEX) 500 MG capsule Take 1 capsule (500 mg total) by mouth 3 (three) times daily. 12/27/19   Fisher, Roselyn Bering, PA-C  ibuprofen (ADVIL) 600 MG tablet Take 1 tablet (600 mg total) by mouth every 8 (eight) hours as needed. 07/23/20   Joni Reining, PA-C  ondansetron (ZOFRAN) 8 MG tablet Take 1 tablet (8 mg total) by mouth every 8 (eight) hours as needed for nausea or vomiting. 07/23/20   Joni Reining, PA-C  Prenatal Vit-Fe Fumarate-FA (PRENATAL MULTIVITAMIN) TABS tablet Take 1 tablet by mouth daily at 12 noon.    [provider]  sertraline (ZOLOFT) 25 MG tablet Take 1 tablet (25 mg total) by mouth daily. 03/03/18   Willodean Rosenthal, MD  sertraline (ZOLOFT) 50 MG tablet Take 1 tablet (50 mg total) by mouth daily. 03/03/18   Willodean Rosenthal, MD  triamterene-hydrochlorothiazide (DYAZIDE) 37.5-25 MG capsule Take 1 each (1 capsule total) by mouth daily. Patient not taking: Reported on 03/03/2018 02/07/18 12/27/19  Levie Heritage, DO    Allergies Patient has no known allergies.  Family History  Problem Relation Age of Onset  . Diabetes Mother     Social History Social History   Tobacco Use  . Smoking status: Never Smoker  . Smokeless tobacco: Never Used  Substance Use Topics  . Alcohol use: No  . Drug use: No    Review of Systems Constitutional: Positive for fever/chills. Normal appetite. ENT: Positive for sore throat. Cardiovascular: Denies chest pain. Respiratory: Negative for shortness of breath. Positive for cough. Negative for wheezing.  Gastrointestinal: Negative for nausea,  no vomiting.  No diarrhea.  Musculoskeletal:  Positive for body aches Skin: Negative for rash. Neurological: Positive for headaches ____________________________________________   PHYSICAL EXAM:  VITAL SIGNS: ED Triage Vitals  Enc Vitals Group     BP 11/14/20 0924 (!) 133/99     Pulse Rate 11/14/20 0924 (!) 103     Resp 11/14/20 0924 17     Temp  11/14/20 0924 98.4 F (36.9 C)     Temp Source 11/14/20 0924 Oral     SpO2 11/14/20 0924 99 %     Weight 11/14/20 0925 195 lb (88.5 kg)     Height 11/14/20 0925 5\' 5"  (1.651 m)     Head Circumference --      Peak Flow --      Pain Score 11/14/20 0924 8     Pain Loc --      Pain Edu? --      Excl. in GC? --     Constitutional: Alert and oriented. Overall well appearing and in no acute distress. Eyes: Conjunctivae are normal. Ears: TM injected. Nose: No sinus congestion noted; no rhinnorhea. Mouth/Throat: Mucous membranes are moist.  Oropharynx erythematous. Tonsils without exudate. Uvula midline. Neck: No stridor.  Lymphatic: No cervical lymphadenopathy. Cardiovascular: Normal rate, regular rhythm. Good peripheral circulation. Respiratory: Respirations are even and unlabored.  No retractions. Breath sounds clear.. Gastrointestinal: Soft and nontender.  Musculoskeletal: FROM x 4 extremities.  Neurologic:  Normal speech and language. Skin:  Skin is warm, dry and intact. No rash noted. Psychiatric: Mood and affect are normal. Speech and behavior are normal.  ____________________________________________   LABS (all labs ordered are listed, but only abnormal results are displayed)  Labs Reviewed  SARS CORONAVIRUS 2 (TAT 6-24 HRS)   ____________________________________________  EKG  Not indicated. ____________________________________________  RADIOLOGY  Not indicated. ____________________________________________   PROCEDURES  Procedure(s) performed: None  Critical Care performed: No ____________________________________________   INITIAL IMPRESSION / ASSESSMENT AND PLAN / ED COURSE  30 y.o. female presents to the ER with COVID like symptoms. She will be tested and work note provided. She is to follow up with her PCP if not improving over the week. She is to return to the ER for symptoms of concern if unable to schedule an appointment.    Medications - No data to  display  ED Discharge Orders         Ordered    naproxen (NAPROSYN) 500 MG tablet  2 times daily with meals        11/14/20 1107    brompheniramine-pseudoephedrine-DM 30-2-10 MG/5ML syrup  4 times daily PRN        11/14/20 1107           Pertinent labs & imaging results that were available during my care of the patient were reviewed by me and considered in my medical decision making (see chart for details).    If controlled substance prescribed during this visit, 12 month history viewed on the [facility] prior to issuing an initial prescription for Schedule II or III opiod. ____________________________________________   FINAL CLINICAL IMPRESSION(S) / ED DIAGNOSES  Final diagnoses:  Viral syndrome    Note:  This document was prepared using Dragon voice recognition software and may include unintentional dictation errors.    01/12/21, FNP 11/14/20 1112    01/12/21, MD 11/14/20 [phone]

## 2020-11-14 NOTE — Discharge Instructions (Signed)
Please follow up with primary care if not improving over the next few days.  Return to the ER for symptoms that change or worsen if unable to schedule an appointment. 

## 2020-11-14 NOTE — ED Notes (Signed)
Pt unable to sign for d/c due to no access to topaz signature pad. Pt verbalizes d/c teaching with no further questions at this time.

## 2020-11-14 NOTE — ED Triage Notes (Signed)
Pt c/o cough with runny nose body aches and sore throat since last night

## 2020-11-16 ENCOUNTER — Ambulatory Visit: Payer: BLUE CROSS/BLUE SHIELD
